# Patient Record
Sex: Female | Born: 1990
Health system: Southern US, Community
[De-identification: ages and names within clinical notes are randomized; demographics above are authoritative.]

## PROBLEM LIST (undated history)

## (undated) DIAGNOSIS — K219 Gastro-esophageal reflux disease without esophagitis: Secondary | ICD-10-CM

## (undated) DIAGNOSIS — G43009 Migraine without aura, not intractable, without status migrainosus: Secondary | ICD-10-CM

## (undated) DIAGNOSIS — E785 Hyperlipidemia, unspecified: Secondary | ICD-10-CM

## (undated) DIAGNOSIS — F32A Depression, unspecified: Secondary | ICD-10-CM

## (undated) DIAGNOSIS — F329 Major depressive disorder, single episode, unspecified: Secondary | ICD-10-CM

## (undated) DIAGNOSIS — J302 Other seasonal allergic rhinitis: Secondary | ICD-10-CM

## (undated) DIAGNOSIS — F419 Anxiety disorder, unspecified: Secondary | ICD-10-CM

## (undated) DIAGNOSIS — J45990 Exercise induced bronchospasm: Secondary | ICD-10-CM

## (undated) HISTORY — DX: Migraine without aura, not intractable, without status migrainosus: G43.009

## (undated) HISTORY — PX: NO PAST SURGERIES: SHX2092

## (undated) HISTORY — DX: Anxiety disorder, unspecified: F41.9

## (undated) HISTORY — DX: Gastro-esophageal reflux disease without esophagitis: K21.9

## (undated) HISTORY — DX: Other seasonal allergic rhinitis: J30.2

## (undated) HISTORY — DX: Major depressive disorder, single episode, unspecified: F32.9

## (undated) HISTORY — DX: Depression, unspecified: F32.A

## (undated) HISTORY — DX: Hyperlipidemia, unspecified: E78.5

## (undated) HISTORY — DX: Exercise induced bronchospasm: J45.990

---

## 2015-12-11 DIAGNOSIS — F419 Anxiety disorder, unspecified: Secondary | ICD-10-CM | POA: Diagnosis not present

## 2015-12-11 DIAGNOSIS — F339 Major depressive disorder, recurrent, unspecified: Secondary | ICD-10-CM | POA: Diagnosis not present

## 2015-12-11 DIAGNOSIS — F329 Major depressive disorder, single episode, unspecified: Secondary | ICD-10-CM | POA: Diagnosis not present

## 2016-01-10 DIAGNOSIS — Z1389 Encounter for screening for other disorder: Secondary | ICD-10-CM | POA: Diagnosis not present

## 2016-01-10 DIAGNOSIS — G43909 Migraine, unspecified, not intractable, without status migrainosus: Secondary | ICD-10-CM | POA: Diagnosis not present

## 2016-01-10 DIAGNOSIS — F418 Other specified anxiety disorders: Secondary | ICD-10-CM | POA: Diagnosis not present

## 2016-02-11 DIAGNOSIS — G43909 Migraine, unspecified, not intractable, without status migrainosus: Secondary | ICD-10-CM | POA: Diagnosis not present

## 2016-02-11 DIAGNOSIS — F418 Other specified anxiety disorders: Secondary | ICD-10-CM | POA: Diagnosis not present

## 2016-02-27 DIAGNOSIS — F419 Anxiety disorder, unspecified: Secondary | ICD-10-CM | POA: Diagnosis not present

## 2016-02-27 DIAGNOSIS — F339 Major depressive disorder, recurrent, unspecified: Secondary | ICD-10-CM | POA: Diagnosis not present

## 2016-02-28 DIAGNOSIS — K529 Noninfective gastroenteritis and colitis, unspecified: Secondary | ICD-10-CM | POA: Diagnosis not present

## 2016-02-28 DIAGNOSIS — F329 Major depressive disorder, single episode, unspecified: Secondary | ICD-10-CM | POA: Diagnosis not present

## 2016-02-28 DIAGNOSIS — R51 Headache: Secondary | ICD-10-CM | POA: Diagnosis not present

## 2016-03-04 DIAGNOSIS — Z113 Encounter for screening for infections with a predominantly sexual mode of transmission: Secondary | ICD-10-CM | POA: Diagnosis not present

## 2016-03-04 DIAGNOSIS — Z6833 Body mass index (BMI) 33.0-33.9, adult: Secondary | ICD-10-CM | POA: Diagnosis not present

## 2016-03-04 DIAGNOSIS — Z124 Encounter for screening for malignant neoplasm of cervix: Secondary | ICD-10-CM | POA: Diagnosis not present

## 2016-03-04 DIAGNOSIS — Z01419 Encounter for gynecological examination (general) (routine) without abnormal findings: Secondary | ICD-10-CM | POA: Diagnosis not present

## 2016-03-04 DIAGNOSIS — Z3041 Encounter for surveillance of contraceptive pills: Secondary | ICD-10-CM | POA: Diagnosis not present

## 2016-03-05 DIAGNOSIS — H9319 Tinnitus, unspecified ear: Secondary | ICD-10-CM | POA: Diagnosis not present

## 2016-03-05 DIAGNOSIS — G43019 Migraine without aura, intractable, without status migrainosus: Secondary | ICD-10-CM | POA: Diagnosis not present

## 2016-03-17 DIAGNOSIS — F331 Major depressive disorder, recurrent, moderate: Secondary | ICD-10-CM | POA: Diagnosis not present

## 2016-03-17 DIAGNOSIS — F419 Anxiety disorder, unspecified: Secondary | ICD-10-CM | POA: Diagnosis not present

## 2016-03-17 DIAGNOSIS — F4310 Post-traumatic stress disorder, unspecified: Secondary | ICD-10-CM | POA: Diagnosis not present

## 2016-03-19 DIAGNOSIS — F419 Anxiety disorder, unspecified: Secondary | ICD-10-CM | POA: Diagnosis not present

## 2016-03-19 DIAGNOSIS — F4310 Post-traumatic stress disorder, unspecified: Secondary | ICD-10-CM | POA: Diagnosis not present

## 2016-03-19 DIAGNOSIS — F331 Major depressive disorder, recurrent, moderate: Secondary | ICD-10-CM | POA: Diagnosis not present

## 2016-03-19 DIAGNOSIS — R41840 Attention and concentration deficit: Secondary | ICD-10-CM | POA: Diagnosis not present

## 2016-03-25 DIAGNOSIS — H9313 Tinnitus, bilateral: Secondary | ICD-10-CM | POA: Diagnosis not present

## 2016-03-28 DIAGNOSIS — G43919 Migraine, unspecified, intractable, without status migrainosus: Secondary | ICD-10-CM | POA: Diagnosis not present

## 2016-04-02 DIAGNOSIS — F419 Anxiety disorder, unspecified: Secondary | ICD-10-CM | POA: Diagnosis not present

## 2016-04-02 DIAGNOSIS — F4310 Post-traumatic stress disorder, unspecified: Secondary | ICD-10-CM | POA: Diagnosis not present

## 2016-04-02 DIAGNOSIS — F331 Major depressive disorder, recurrent, moderate: Secondary | ICD-10-CM | POA: Diagnosis not present

## 2016-04-15 DIAGNOSIS — F331 Major depressive disorder, recurrent, moderate: Secondary | ICD-10-CM | POA: Diagnosis not present

## 2016-04-29 DIAGNOSIS — F331 Major depressive disorder, recurrent, moderate: Secondary | ICD-10-CM | POA: Diagnosis not present

## 2016-04-29 DIAGNOSIS — F4310 Post-traumatic stress disorder, unspecified: Secondary | ICD-10-CM | POA: Diagnosis not present

## 2016-04-29 DIAGNOSIS — F419 Anxiety disorder, unspecified: Secondary | ICD-10-CM | POA: Diagnosis not present

## 2016-05-02 DIAGNOSIS — Z23 Encounter for immunization: Secondary | ICD-10-CM | POA: Diagnosis not present

## 2016-05-05 DIAGNOSIS — F419 Anxiety disorder, unspecified: Secondary | ICD-10-CM | POA: Diagnosis not present

## 2016-05-05 DIAGNOSIS — G479 Sleep disorder, unspecified: Secondary | ICD-10-CM | POA: Diagnosis not present

## 2016-05-05 DIAGNOSIS — R41840 Attention and concentration deficit: Secondary | ICD-10-CM | POA: Diagnosis not present

## 2016-05-05 DIAGNOSIS — F331 Major depressive disorder, recurrent, moderate: Secondary | ICD-10-CM | POA: Diagnosis not present

## 2016-05-07 DIAGNOSIS — H9319 Tinnitus, unspecified ear: Secondary | ICD-10-CM | POA: Diagnosis not present

## 2016-05-07 DIAGNOSIS — G43909 Migraine, unspecified, not intractable, without status migrainosus: Secondary | ICD-10-CM | POA: Diagnosis not present

## 2016-05-07 DIAGNOSIS — F329 Major depressive disorder, single episode, unspecified: Secondary | ICD-10-CM | POA: Diagnosis not present

## 2016-05-12 DIAGNOSIS — F331 Major depressive disorder, recurrent, moderate: Secondary | ICD-10-CM | POA: Diagnosis not present

## 2016-05-12 DIAGNOSIS — F4310 Post-traumatic stress disorder, unspecified: Secondary | ICD-10-CM | POA: Diagnosis not present

## 2016-05-12 DIAGNOSIS — F419 Anxiety disorder, unspecified: Secondary | ICD-10-CM | POA: Diagnosis not present

## 2016-05-26 DIAGNOSIS — F331 Major depressive disorder, recurrent, moderate: Secondary | ICD-10-CM | POA: Diagnosis not present

## 2016-05-26 DIAGNOSIS — F419 Anxiety disorder, unspecified: Secondary | ICD-10-CM | POA: Diagnosis not present

## 2016-05-26 DIAGNOSIS — F4310 Post-traumatic stress disorder, unspecified: Secondary | ICD-10-CM | POA: Diagnosis not present

## 2016-05-28 DIAGNOSIS — Z719 Counseling, unspecified: Secondary | ICD-10-CM | POA: Diagnosis not present

## 2016-06-03 DIAGNOSIS — F331 Major depressive disorder, recurrent, moderate: Secondary | ICD-10-CM | POA: Diagnosis not present

## 2016-06-03 DIAGNOSIS — F419 Anxiety disorder, unspecified: Secondary | ICD-10-CM | POA: Diagnosis not present

## 2016-06-03 DIAGNOSIS — G479 Sleep disorder, unspecified: Secondary | ICD-10-CM | POA: Diagnosis not present

## 2016-06-03 DIAGNOSIS — R41840 Attention and concentration deficit: Secondary | ICD-10-CM | POA: Diagnosis not present

## 2016-06-18 DIAGNOSIS — F4310 Post-traumatic stress disorder, unspecified: Secondary | ICD-10-CM | POA: Diagnosis not present

## 2016-06-18 DIAGNOSIS — F419 Anxiety disorder, unspecified: Secondary | ICD-10-CM | POA: Diagnosis not present

## 2016-06-18 DIAGNOSIS — F331 Major depressive disorder, recurrent, moderate: Secondary | ICD-10-CM | POA: Diagnosis not present

## 2016-07-08 DIAGNOSIS — R41840 Attention and concentration deficit: Secondary | ICD-10-CM | POA: Diagnosis not present

## 2016-07-08 DIAGNOSIS — F331 Major depressive disorder, recurrent, moderate: Secondary | ICD-10-CM | POA: Diagnosis not present

## 2016-07-08 DIAGNOSIS — G479 Sleep disorder, unspecified: Secondary | ICD-10-CM | POA: Diagnosis not present

## 2016-07-08 DIAGNOSIS — F419 Anxiety disorder, unspecified: Secondary | ICD-10-CM | POA: Diagnosis not present

## 2016-07-29 DIAGNOSIS — R9431 Abnormal electrocardiogram [ECG] [EKG]: Secondary | ICD-10-CM | POA: Diagnosis not present

## 2016-08-04 DIAGNOSIS — H9319 Tinnitus, unspecified ear: Secondary | ICD-10-CM | POA: Diagnosis not present

## 2016-08-04 DIAGNOSIS — G43909 Migraine, unspecified, not intractable, without status migrainosus: Secondary | ICD-10-CM | POA: Diagnosis not present

## 2016-08-04 DIAGNOSIS — G43109 Migraine with aura, not intractable, without status migrainosus: Secondary | ICD-10-CM | POA: Diagnosis not present

## 2016-08-11 DIAGNOSIS — F331 Major depressive disorder, recurrent, moderate: Secondary | ICD-10-CM | POA: Diagnosis not present

## 2016-08-11 DIAGNOSIS — F4310 Post-traumatic stress disorder, unspecified: Secondary | ICD-10-CM | POA: Diagnosis not present

## 2016-08-11 DIAGNOSIS — F419 Anxiety disorder, unspecified: Secondary | ICD-10-CM | POA: Diagnosis not present

## 2016-08-12 DIAGNOSIS — F331 Major depressive disorder, recurrent, moderate: Secondary | ICD-10-CM | POA: Diagnosis not present

## 2016-08-12 DIAGNOSIS — F900 Attention-deficit hyperactivity disorder, predominantly inattentive type: Secondary | ICD-10-CM | POA: Diagnosis not present

## 2016-08-12 DIAGNOSIS — F419 Anxiety disorder, unspecified: Secondary | ICD-10-CM | POA: Diagnosis not present

## 2016-08-12 DIAGNOSIS — G479 Sleep disorder, unspecified: Secondary | ICD-10-CM | POA: Diagnosis not present

## 2016-09-09 DIAGNOSIS — F4310 Post-traumatic stress disorder, unspecified: Secondary | ICD-10-CM | POA: Diagnosis not present

## 2016-09-09 DIAGNOSIS — G479 Sleep disorder, unspecified: Secondary | ICD-10-CM | POA: Diagnosis not present

## 2016-09-09 DIAGNOSIS — F331 Major depressive disorder, recurrent, moderate: Secondary | ICD-10-CM | POA: Diagnosis not present

## 2016-09-09 DIAGNOSIS — F419 Anxiety disorder, unspecified: Secondary | ICD-10-CM | POA: Diagnosis not present

## 2016-09-14 DIAGNOSIS — F3341 Major depressive disorder, recurrent, in partial remission: Secondary | ICD-10-CM | POA: Diagnosis not present

## 2016-09-14 DIAGNOSIS — G43009 Migraine without aura, not intractable, without status migrainosus: Secondary | ICD-10-CM | POA: Diagnosis not present

## 2016-09-14 DIAGNOSIS — F419 Anxiety disorder, unspecified: Secondary | ICD-10-CM | POA: Diagnosis not present

## 2016-09-14 DIAGNOSIS — R110 Nausea: Secondary | ICD-10-CM | POA: Diagnosis not present

## 2016-11-07 DIAGNOSIS — F331 Major depressive disorder, recurrent, moderate: Secondary | ICD-10-CM | POA: Diagnosis not present

## 2016-11-07 DIAGNOSIS — G479 Sleep disorder, unspecified: Secondary | ICD-10-CM | POA: Diagnosis not present

## 2016-11-07 DIAGNOSIS — F900 Attention-deficit hyperactivity disorder, predominantly inattentive type: Secondary | ICD-10-CM | POA: Diagnosis not present

## 2016-11-07 DIAGNOSIS — F419 Anxiety disorder, unspecified: Secondary | ICD-10-CM | POA: Diagnosis not present

## 2017-01-05 DIAGNOSIS — G479 Sleep disorder, unspecified: Secondary | ICD-10-CM | POA: Diagnosis not present

## 2017-01-05 DIAGNOSIS — F419 Anxiety disorder, unspecified: Secondary | ICD-10-CM | POA: Diagnosis not present

## 2017-01-05 DIAGNOSIS — F331 Major depressive disorder, recurrent, moderate: Secondary | ICD-10-CM | POA: Diagnosis not present

## 2017-01-05 DIAGNOSIS — F900 Attention-deficit hyperactivity disorder, predominantly inattentive type: Secondary | ICD-10-CM | POA: Diagnosis not present

## 2017-01-07 DIAGNOSIS — F329 Major depressive disorder, single episode, unspecified: Secondary | ICD-10-CM | POA: Diagnosis not present

## 2017-01-07 DIAGNOSIS — G43009 Migraine without aura, not intractable, without status migrainosus: Secondary | ICD-10-CM | POA: Diagnosis not present

## 2017-02-12 DIAGNOSIS — E7800 Pure hypercholesterolemia, unspecified: Secondary | ICD-10-CM | POA: Diagnosis not present

## 2017-02-12 DIAGNOSIS — E669 Obesity, unspecified: Secondary | ICD-10-CM | POA: Diagnosis not present

## 2017-02-13 DIAGNOSIS — G479 Sleep disorder, unspecified: Secondary | ICD-10-CM | POA: Diagnosis not present

## 2017-02-13 DIAGNOSIS — F419 Anxiety disorder, unspecified: Secondary | ICD-10-CM | POA: Diagnosis not present

## 2017-02-13 DIAGNOSIS — F331 Major depressive disorder, recurrent, moderate: Secondary | ICD-10-CM | POA: Diagnosis not present

## 2017-02-13 DIAGNOSIS — F900 Attention-deficit hyperactivity disorder, predominantly inattentive type: Secondary | ICD-10-CM | POA: Diagnosis not present

## 2017-04-10 DIAGNOSIS — G479 Sleep disorder, unspecified: Secondary | ICD-10-CM | POA: Diagnosis not present

## 2017-04-10 DIAGNOSIS — F331 Major depressive disorder, recurrent, moderate: Secondary | ICD-10-CM | POA: Diagnosis not present

## 2017-04-10 DIAGNOSIS — F900 Attention-deficit hyperactivity disorder, predominantly inattentive type: Secondary | ICD-10-CM | POA: Diagnosis not present

## 2017-04-10 DIAGNOSIS — F419 Anxiety disorder, unspecified: Secondary | ICD-10-CM | POA: Diagnosis not present

## 2017-04-16 MED FILL — PANTOPRAZOLE SOD DR 40 MG T: 40 | 30 days supply | Qty: 30 | Fill #0

## 2017-05-10 MED FILL — LARIN FE 1.5-30 TABLET: 1.5-30 | 28 days supply | Qty: 28 | Fill #0

## 2017-05-17 MED FILL — PANTOPRAZOLE SOD DR 40 MG T: 40 | 30 days supply | Qty: 30 | Fill #1

## 2017-05-20 DIAGNOSIS — Z124 Encounter for screening for malignant neoplasm of cervix: Secondary | ICD-10-CM | POA: Diagnosis not present

## 2017-05-20 DIAGNOSIS — Z3041 Encounter for surveillance of contraceptive pills: Secondary | ICD-10-CM | POA: Diagnosis not present

## 2017-05-20 DIAGNOSIS — Z01419 Encounter for gynecological examination (general) (routine) without abnormal findings: Secondary | ICD-10-CM | POA: Diagnosis not present

## 2017-05-20 DIAGNOSIS — Z6838 Body mass index (BMI) 38.0-38.9, adult: Secondary | ICD-10-CM | POA: Diagnosis not present

## 2017-05-21 DIAGNOSIS — F331 Major depressive disorder, recurrent, moderate: Secondary | ICD-10-CM | POA: Diagnosis not present

## 2017-05-21 DIAGNOSIS — G479 Sleep disorder, unspecified: Secondary | ICD-10-CM | POA: Diagnosis not present

## 2017-05-21 DIAGNOSIS — F900 Attention-deficit hyperactivity disorder, predominantly inattentive type: Secondary | ICD-10-CM | POA: Diagnosis not present

## 2017-05-21 DIAGNOSIS — F419 Anxiety disorder, unspecified: Secondary | ICD-10-CM | POA: Diagnosis not present

## 2017-05-27 ENCOUNTER — Ambulatory Visit (INDEPENDENT_AMBULATORY_CARE_PROVIDER_SITE_OTHER): Payer: BLUE CROSS/BLUE SHIELD | Admitting: Family Medicine

## 2017-05-27 ENCOUNTER — Encounter: Payer: Self-pay | Admitting: Family Medicine

## 2017-05-27 VITALS — BP 122/80 | HR 86 | Ht 63.25 in | Wt 222.0 lb

## 2017-05-27 DIAGNOSIS — R635 Abnormal weight gain: Secondary | ICD-10-CM

## 2017-05-27 DIAGNOSIS — R5383 Other fatigue: Secondary | ICD-10-CM

## 2017-05-27 DIAGNOSIS — M7989 Other specified soft tissue disorders: Secondary | ICD-10-CM | POA: Diagnosis not present

## 2017-05-27 DIAGNOSIS — E669 Obesity, unspecified: Secondary | ICD-10-CM

## 2017-05-27 DIAGNOSIS — F329 Major depressive disorder, single episode, unspecified: Secondary | ICD-10-CM | POA: Diagnosis not present

## 2017-05-27 DIAGNOSIS — Z79899 Other long term (current) drug therapy: Secondary | ICD-10-CM

## 2017-05-27 DIAGNOSIS — R600 Localized edema: Secondary | ICD-10-CM

## 2017-05-27 DIAGNOSIS — G43009 Migraine without aura, not intractable, without status migrainosus: Secondary | ICD-10-CM

## 2017-05-27 DIAGNOSIS — Z8639 Personal history of other endocrine, nutritional and metabolic disease: Secondary | ICD-10-CM

## 2017-05-27 DIAGNOSIS — F419 Anxiety disorder, unspecified: Secondary | ICD-10-CM | POA: Diagnosis not present

## 2017-05-27 DIAGNOSIS — K219 Gastro-esophageal reflux disease without esophagitis: Secondary | ICD-10-CM | POA: Insufficient documentation

## 2017-05-27 DIAGNOSIS — R609 Edema, unspecified: Secondary | ICD-10-CM | POA: Diagnosis not present

## 2017-05-27 DIAGNOSIS — F32A Depression, unspecified: Secondary | ICD-10-CM | POA: Insufficient documentation

## 2017-05-27 LAB — POCT URINALYSIS DIP (PROADVANTAGE DEVICE)
Bilirubin, UA: NEGATIVE
Blood, UA: NEGATIVE
Glucose, UA: NEGATIVE mg/dL
Ketones, POC UA: NEGATIVE mg/dL
Nitrite, UA: NEGATIVE
Protein Ur, POC: NEGATIVE mg/dL
Specific Gravity, Urine: 1.03
Urobilinogen, Ur: NEGATIVE
pH, UA: 6

## 2017-05-27 NOTE — Progress Notes (Signed)
Subjective:    Patient ID: Alicia Weiss, female    DOB: 10/28/1990, 28 y.o.   MRN: [id number]  HPI Chief Complaint  Patient presents with  . new pt    new pt, get established, blood pressure has been running high but don't know actual numbers but states numbers are close to each other with top and bottom number,  want chol checked but not fasting today   She is new to the practice and here to establish care and for multiple concerns.  Previous medical care: [place], [place] and moved here in May 2018. She continues traveling back and forth seeing multiple specialists.  Dr. Wannetta Sender PCP in [place].  Dr. Jeri Cos- OB/GYN in [facility]- psychiatrist in [place]. Is being treated for depression and anxiety. New medication Viibryd, states she started on this several months ago. Has tried multiple medications without success including: Cymbalta (headaches and zombie-like), Sertraline, Wellbutrin, Lexapro, citalopram.  States she takes Hydroxyzine for anxiety. Rarely takes this due to sedation.  States she was taking Rexalti in the past and stopped it last week due to increased heart rate.  Dr. Sandy Salaam- neurologist for migraines. Diagnosed 3 years ago. Started on Maxalt at that time and then Imitrex.   Migraines - Imitrex in past but was causing "serotonin syndrome" and she was switched this week to Fiorcet but has not yet tried this. States she has not had a migraine this week.   ADHD- has taken medication in the past for this. Tried Vyvnanse - No longer taking this. States she stopped this due to increased heart rate.   Other providers: none locally. Is going back to [facility] for her specialists.   Concerns today include having elevated blood pressure in the past. She is not sure what her BP was but has been told her BP was elevated in the past month.   States her heart rate has been elevated to 116 while at work according to her fit bit. EKG in the past was normal. History of  palpitations. No chest pain, shortness of breath, cough, orthopnea.   Complains of hands and feet feeling swollen since starting work in August. Swelling occasionally goes away at night.  Reports a 25-30 lb weight gain over the past 6 months. She does not know how many calories she is eating per day but does report change in eating habits since moving here in May. She is not exercising.  New job that requires her standing all day.  Reports having multiple medication changes for depression and anxiety over the past few months.   States she has been feeling more tired than usual over the past several months.   Denies fever, chills, dizziness, chest pain, abdominal pain, N/V/D, or urinary symptoms. Denies numbness, tingling or weakness. No arthralgias or myalgias.     Social history: Lives with her boyfriend, works at [employer].  No children.   Depression screen PHQ 2/9 05/27/2017  Decreased Interest 1  Down, Depressed, Hopeless 1  PHQ - 2 Score 2  Altered sleeping 0  Tired, decreased energy 3  Change in appetite 3  Feeling bad or failure about yourself  0  Trouble concentrating 3  Moving slowly or fidgety/restless 0  Suicidal thoughts 0  PHQ-9 Score 11  Difficult doing work/chores Somewhat difficult    Health maintenance:  Last Gynecological Exam: pap smear- last week.  Last Menstrual cycle: regular cycles. Last one 3 weeks ago.  Pregnancies: 0 Contraception: OCPs  Reviewed allergies, medications, past  medical, surgical, family, and social history.   Review of Systems Pertinent positives and negatives in the history of present illness.      Objective:   Physical Exam BP 122/80   Pulse 86   Ht 5' 3.25" (1.607 m)   Wt 222 lb (100.7 kg)   LMP 05/06/2017   BMI 39.02 kg/m   Alert and in no distress.  Pharyngeal area is normal. Neck is supple without adenopathy or thyromegaly. Cardiac exam shows a regular sinus rhythm without murmurs or gallops. Lungs are  clear to auscultation. Skin warm and dry, no pallor. Extremities without edema, normal pulses.       Assessment & Plan:  Swelling of both hands - Plan: CBC with Differential/Platelet, POCT Urinalysis DIP (Proadvantage Device)  Anxiety and depression - Plan: TSH  Migraine without aura and without status migrainosus, not intractable  Fatigue, unspecified type - Plan: CBC with Differential/Platelet, Comprehensive metabolic panel, POCT Urinalysis DIP (Proadvantage Device), TSH, VITAMIN D 25 Hydroxy (Vit-D Deficiency, Fractures)  Mild peripheral edema - Plan: POCT Urinalysis DIP (Proadvantage Device), TSH  Obesity (BMI 30-39.9) - Plan: TSH  History of vitamin D deficiency - Plan: VITAMIN D 25 Hydroxy (Vit-D Deficiency, Fractures)  Medication management - Plan: VITAMIN D 25 Hydroxy (Vit-D Deficiency, Fractures)  Discussed that the tightness she is experiencing in her hands and feet may be due to high salt diet and recent weight gain.  Will check labs to look for underlying etiologies for fatigue, weight gain, subjective edema.  Recommend elevating her legs when possible and wearing compression stockings at work.  Discussed that her BP and heart are in goal range. Taught her how to check carotid pulse and recommend that if she feels like her heart is beating fast to check it and keep a record of this.  She will also by a BP cuff and check her BP and heart rate. Bring in the readings for these to her next visit.  Recent weight gain- advised her to try using a free app like My Fitness Pal and to track her calories and nutrition intake. Start taking walks or doing some type of exercise.  She will continue seeing specialists as needed for chronic health conditions.  She is not suicidal. Mood is stable per patient.  She would like to have her cholesterol checked and will return for a fasting CPE at her convenience.  Follow up in 3-4 weeks for concerns with BP, heart rate and weight.  Spent a  minimum of 45 minutes with patient face to face and at least 50% was in counseling and coordination of care.

## 2017-05-27 NOTE — Patient Instructions (Signed)
Cut back on salt, fried foods, fatty foods, creamy sauces and dressings. Increase healthy foods, drink more water and start getting some type of exercise.  You may wear compression stockings and elevate your legs when possible to see if this helps with the swelling in your feet.   Start checking your blood pressure and heart rate and keep a record of these readings.  Goal BP is <130/80  Bring in your BP cuff and readings to your next visit.   Check your heart rate as we demonstrated in the office today if you feel like your heart rate is too fast or abnormal.   Start using a free app like My fitness pal to track your calories and nutrition.   We will call you with your lab results.

## 2017-05-28 LAB — CBC WITH DIFFERENTIAL/PLATELET
Basophils Absolute: 53 cells/uL (ref 0–200)
Basophils Relative: 0.7 %
Eosinophils Absolute: 122 cells/uL (ref 15–500)
Eosinophils Relative: 1.6 %
HCT: 36.9 % (ref 35.0–45.0)
Hemoglobin: 12.4 g/dL (ref 11.7–15.5)
Lymphs Abs: 2105 cells/uL (ref 850–3900)
MCH: 28.1 pg (ref 27.0–33.0)
MCHC: 33.6 g/dL (ref 32.0–36.0)
MCV: 83.7 fL (ref 80.0–100.0)
MPV: 11.6 fL (ref 7.5–12.5)
Monocytes Relative: 5.7 %
Neutro Abs: 4887 cells/uL (ref 1500–7800)
Neutrophils Relative %: 64.3 %
Platelets: 401 10*3/uL — ABNORMAL HIGH (ref 140–400)
RBC: 4.41 10*6/uL (ref 3.80–5.10)
RDW: 12.3 % (ref 11.0–15.0)
Total Lymphocyte: 27.7 %
WBC mixed population: 433 cells/uL (ref 200–950)
WBC: 7.6 10*3/uL (ref 3.8–10.8)

## 2017-05-28 LAB — COMPREHENSIVE METABOLIC PANEL
AG Ratio: 1.3 (calc) (ref 1.0–2.5)
ALT: 11 U/L (ref 6–29)
AST: 11 U/L (ref 10–30)
Albumin: 4 g/dL (ref 3.6–5.1)
Alkaline phosphatase (APISO): 54 U/L (ref 33–115)
BUN: 9 mg/dL (ref 7–25)
CO2: 24 mmol/L (ref 20–32)
Calcium: 9.8 mg/dL (ref 8.6–10.2)
Chloride: 103 mmol/L (ref 98–110)
Creat: 0.89 mg/dL (ref 0.50–1.10)
Globulin: 3 g/dL (calc) (ref 1.9–3.7)
Glucose, Bld: 80 mg/dL (ref 65–99)
Potassium: 4.5 mmol/L (ref 3.5–5.3)
Sodium: 137 mmol/L (ref 135–146)
Total Bilirubin: 0.2 mg/dL (ref 0.2–1.2)
Total Protein: 7 g/dL (ref 6.1–8.1)

## 2017-05-28 LAB — VITAMIN D 25 HYDROXY (VIT D DEFICIENCY, FRACTURES): Vit D, 25-Hydroxy: 26 ng/mL — ABNORMAL LOW (ref 30–100)

## 2017-05-28 LAB — TSH: TSH: 1.27 mIU/L

## 2017-06-16 MED FILL — PANTOPRAZOLE SOD DR 40 MG T: 40 | 30 days supply | Qty: 30 | Fill #2

## 2017-06-22 NOTE — Progress Notes (Signed)
Subjective:    Patient ID: Alicia Weiss, female    DOB: 1990/11/09, 26 y.o.   MRN: [id number]  HPI Chief Complaint  Patient presents with  . Annual Exam    fasting annual exam, no pap sees someone is [place]. Also follow up on hand swelling and blood pressure. Has a spot on front of left shin that she would like looked at today.   She is here for a complete physical exam. Last CPE: 2 years ago   She is requesting a neurologist referral for migraine headache history. States she was diagnosed 3 years ago in [place].  States she has been traveling back and forth to [place] neurologist in [place]. Complicated migraine history per patient. States she has multiple therapies in the past past without success. States she was most recently prescribed Fioricet and this is not helping. She would like to see someone to discuss other treatment options.  States she is getting migraine headaches every week lasting 1-2 days. No aura. Mainly on left in occipital region. Typically has nausea, photophobia and phonophobia.  States she has had 2 MRIs in the past that were negative.   She has been checking her BP at home. Readings at home have been in goal range. 112/78 and in this range.   She stopped taking Rexulti because she thought she was having side effects such as flushing and elevated HR. States those symptoms have resolved.   Anxiety and depression - under the care of [place] psychiatrist in [place].   States she has a small dark mole on her left leg that she would like to have checked. Thinks it is not changing.   Other providers: Dr. Jeri CosPestoff- in [place] is [place]  Takes Protonix daily for GERD.   Social history: Lives with boyfriend, works as Associate Professorpharmacy tech at [employer] Denies smoking, drinking alcohol, drug use  Diet: no changes.  Excerise: nothing  Reports negative HIV test last year.   Immunizations: up to date on flu and Tdap  Health maintenance:  Mammogram: never Colonoscopy: never Last  Pap Smear: last month. Normal.  Last Menstrual cycle: 3 weeks ago.  Pregnancies: 0 Last Dental Exam: 2016  Last Eye Exam: last year   Declines STD testing.   Wears seatbelt always, uses sunscreen, smoke detectors in home and functioning, does not text while driving and feels safe in home environment.   Reviewed allergies, medications, past medical, surgical, family, and social history.   Review of Systems Review of Systems Constitutional: -fever, -chills, -sweats, -unexpected weight change,-fatigue ENT: -runny nose, -ear pain, -sore throat Cardiology:  -chest pain, -palpitations, -edema Respiratory: -cough, -shortness of breath, -wheezing Gastroenterology: -abdominal pain, -nausea, -vomiting, -diarrhea, -constipation  Hematology: -bleeding or bruising problems Musculoskeletal: -arthralgias, -myalgias, -joint swelling, -back pain Ophthalmology: -vision changes Urology: -dysuria, -difficulty urinating, -hematuria, -urinary frequency, -urgency Neurology: -headache, -weakness, -tingling, -numbness       Objective:   Physical Exam BP 138/88   Pulse 80   Ht 5' 3.5" (1.613 m)   Wt 220 lb (99.8 kg)   LMP 05/26/2017 (Approximate)   BMI 38.36 kg/m   General Appearance:    Alert, cooperative, no distress, appears stated age  Head:    Normocephalic, without obvious abnormality, atraumatic  Eyes:    PERRL, conjunctiva/corneas clear, EOM's intact, fundi    benign  Ears:    Normal TM's and external ear canals  Nose:   Nares normal, mucosa normal, no drainage or sinus   tenderness  Throat:   Lips,  mucosa, and tongue normal; teeth and gums normal  Neck:   Supple, no lymphadenopathy;  thyroid:  no   enlargement/tenderness/nodules; no carotid   bruit or JVD  Back:    Spine nontender, no curvature, ROM normal, no CVA     tenderness  Lungs:     Clear to auscultation bilaterally without wheezes, rales or     ronchi; respirations unlabored  Chest Wall:    No tenderness or deformity    Heart:    Regular rate and rhythm, S1 and S2 normal, no murmur, rub   or gallop  Breast Exam:    OB/GYN  Abdomen:     Soft, non-tender, nondistended, normoactive bowel sounds,    no masses, no hepatosplenomegaly  Genitalia:    OB/GYN  Rectal:    Not performed due to age<40 and no related complaints  Extremities:   No clubbing, cyanosis or edema  Pulses:   2+ and symmetric all extremities  Skin:   Skin color, texture, turgor normal, no rashes. Small, round dark spot on left anterior lower leg.   Lymph nodes:   Cervical, supraclavicular, and axillary nodes normal  Neurologic:   CNII-XII intact, normal strength, sensation and gait; reflexes 2+ and symmetric throughout          Psych:   Normal mood, affect, hygiene and grooming.     urinalysis dipstick: negative      Assessment & Plan:  Routine general medical examination at a health care facility - Plan: Visual acuity screening, Lipid panel, POCT Urinalysis DIP (Proadvantage Device)  Gastroesophageal reflux disease, esophagitis presence not specified  Migraine without aura and without status migrainosus, not intractable - Plan: Ambulatory referral to Neurology  Obesity (BMI 30-39.9)  Skin lesion of left leg  She appears to be doing well. Denies feeling depressed or anxious lately. States medication is working well for her. [place] monthly.  Will refer to neurologist for chronic migraines.  Counseled on healthy lifestyle, cutting back on carbohydrates and increasing exercise.  Discussed benign appearance of lesion on leg. Encouraged her to take a picture of the spot. Encouraged her to keep an eye on the area and if she notices changes to see a dermatologist or return.

## 2017-06-23 ENCOUNTER — Encounter: Payer: Self-pay | Admitting: Family Medicine

## 2017-06-23 ENCOUNTER — Ambulatory Visit (INDEPENDENT_AMBULATORY_CARE_PROVIDER_SITE_OTHER): Payer: BLUE CROSS/BLUE SHIELD | Admitting: Family Medicine

## 2017-06-23 VITALS — BP 138/88 | HR 80 | Ht 63.5 in | Wt 220.0 lb

## 2017-06-23 DIAGNOSIS — E669 Obesity, unspecified: Secondary | ICD-10-CM | POA: Diagnosis not present

## 2017-06-23 DIAGNOSIS — L989 Disorder of the skin and subcutaneous tissue, unspecified: Secondary | ICD-10-CM | POA: Diagnosis not present

## 2017-06-23 DIAGNOSIS — G43009 Migraine without aura, not intractable, without status migrainosus: Secondary | ICD-10-CM

## 2017-06-23 DIAGNOSIS — K219 Gastro-esophageal reflux disease without esophagitis: Secondary | ICD-10-CM | POA: Diagnosis not present

## 2017-06-23 DIAGNOSIS — Z0000 Encounter for general adult medical examination without abnormal findings: Secondary | ICD-10-CM

## 2017-06-23 LAB — POCT URINALYSIS DIP (PROADVANTAGE DEVICE)
Bilirubin, UA: NEGATIVE
Blood, UA: NEGATIVE
Glucose, UA: NEGATIVE mg/dL
Ketones, POC UA: NEGATIVE mg/dL
Leukocytes, UA: NEGATIVE
Nitrite, UA: NEGATIVE
Protein Ur, POC: NEGATIVE mg/dL
Specific Gravity, Urine: 1.03
Urobilinogen, Ur: NEGATIVE
pH, UA: 6 (ref 5.0–8.0)

## 2017-06-23 LAB — LIPID PANEL
Cholesterol: 224 mg/dL — ABNORMAL HIGH (ref <200)
HDL: 44 mg/dL — ABNORMAL LOW (ref >50)
LDL Cholesterol (Calc): 135 mg/dL (calc) — ABNORMAL HIGH
Non-HDL Cholesterol (Calc): 180 mg/dL (calc) — ABNORMAL HIGH (ref <130)
Total CHOL/HDL Ratio: 5.1 (calc) — ABNORMAL HIGH (ref <5.0)
Triglycerides: 313 mg/dL — ABNORMAL HIGH (ref <150)

## 2017-06-23 NOTE — Patient Instructions (Addendum)
You should receive a phone call from [facility] in the next 2 weeks. Let me know if you do not hear from them.  We will call you with your lab results.   Try to skip some days with the Protonix. Avoid food triggers that make your reflux worse.    Heartburn Heartburn is a type of pain or discomfort that can happen in the throat or chest. It is often described as a burning pain. It may also cause a bad taste in the mouth. Heartburn may feel worse when you lie down or bend over, and it is often worse at night. Heartburn may be caused by stomach contents that move back up into the esophagus (reflux). Follow these instructions at home: Take these actions to decrease your discomfort and to help avoid complications. Diet  Follow a diet as recommended by your health care provider. This may involve avoiding foods and drinks such as: ? Coffee and tea (with or without caffeine). ? Drinks that contain alcohol. ? Energy drinks and sports drinks. ? Carbonated drinks or sodas. ? Chocolate and cocoa. ? Peppermint and mint flavorings. ? Garlic and onions. ? Horseradish. ? Spicy and acidic foods, including peppers, chili powder, curry powder, vinegar, hot sauces, and barbecue sauce. ? Citrus fruit juices and citrus fruits, such as oranges, lemons, and limes. ? Tomato-based foods, such as red sauce, chili, salsa, and pizza with red sauce. ? Fried and fatty foods, such as donuts, french fries, potato chips, and high-fat dressings. ? High-fat meats, such as hot dogs and fatty cuts of red and white meats, such as rib eye steak, sausage, ham, and bacon. ? High-fat dairy items, such as whole milk, butter, and cream cheese.  Eat small, frequent meals instead of large meals.  Avoid drinking large amounts of liquid with your meals.  Avoid eating meals during the 2-3 hours before bedtime.  Avoid lying down right after you eat.  Do not exercise right after you eat. General instructions  Pay  attention to any changes in your symptoms.  Take over-the-counter and prescription medicines only as told by your health care provider. Do not take aspirin, ibuprofen, or other NSAIDs unless your health care provider told you to do so.  Do not use any tobacco products, including cigarettes, chewing tobacco, and e-cigarettes. If you need help quitting, ask your health care provider.  Wear loose-fitting clothing. Do not wear anything tight around your waist that causes pressure on your abdomen.  Raise (elevate) the head of your bed about 6 inches (15 cm).  Try to reduce your stress, such as with yoga or meditation. If you need help reducing stress, ask your health care provider.  If you are overweight, reduce your weight to an amount that is healthy for you. Ask your health care provider for guidance about a safe weight loss goal.  Keep all follow-up visits as told by your health care provider. This is important. Contact a health care provider if:  You have new symptoms.  You have unexplained weight loss.  You have difficulty swallowing, or it hurts to swallow.  You have wheezing or a persistent cough.  Your symptoms do not improve with treatment.  You have frequent heartburn for more than two weeks. Get help right away if:  You have pain in your arms, neck, jaw, teeth, or back.  You feel sweaty, dizzy, or light-headed.  You have chest pain or shortness of breath.  You vomit and your vomit looks like blood or  coffee grounds.  Your stool is bloody or black. This information is not intended to replace advice given to you by your health care provider. Make sure you discuss any questions you have with your health care provider. Document Released: 12/06/2008 Document Revised: 12/26/2015 Document Reviewed: 11/14/2014 Elsevier Interactive Patient Education  2017 [employer].     Preventative Care for Adults - Female      MAINTAIN REGULAR HEALTH EXAMS:  A routine yearly  physical is a good way to check in with your primary care provider about your health and preventive screening. It is also an opportunity to share updates about your health and any concerns you have, and receive a thorough all-over exam.   Most health insurance companies pay for at least some preventative services.  Check with your health plan for specific coverages.  WHAT PREVENTATIVE SERVICES DO WOMEN NEED?  Adult women should have their weight and blood pressure checked regularly.   Women age 33 and older should have their cholesterol levels checked regularly.  Women should be screened for cervical cancer with a Pap smear and pelvic exam beginning at either age 59, or 3 years after they become sexually activity.    Breast cancer screening generally begins at age 53 with a mammogram and breast exam by your primary care provider.    Beginning at age 36 and continuing to age 65, women should be screened for colorectal cancer.  Certain people may need continued testing until age 62.  Updating vaccinations is part of preventative care.  Vaccinations help protect against diseases such as the flu.  Osteoporosis is a disease in which the bones lose minerals and strength as we age. Women ages 42 and over should discuss this with their caregivers, as should women after menopause who have other risk factors.  Lab tests are generally done as part of preventative care to screen for anemia and blood disorders, to screen for problems with the kidneys and liver, to screen for bladder problems, to check blood sugar, and to check your cholesterol level.  Preventative services generally include counseling about diet, exercise, avoiding tobacco, drugs, excessive alcohol consumption, and sexually transmitted infections.    GENERAL RECOMMENDATIONS FOR GOOD HEALTH:  Healthy diet:  Eat a variety of foods, including fruit, vegetables, animal or vegetable protein, such as meat, fish, chicken, and eggs, or beans,  lentils, tofu, and grains, such as rice.  Drink plenty of water daily.  Decrease saturated fat in the diet, avoid lots of red meat, processed foods, sweets, fast foods, and fried foods.  Exercise:  Aerobic exercise helps maintain good heart health. At least 30-40 minutes of moderate-intensity exercise is recommended. For example, a brisk walk that increases your heart rate and breathing. This should be done on most days of the week.   Find a type of exercise or a variety of exercises that you enjoy so that it becomes a part of your daily life.  Examples are running, walking, swimming, water aerobics, and biking.  For motivation and support, explore group exercise such as aerobic class, spin class, Zumba, Yoga,or  martial arts, etc.    Set exercise goals for yourself, such as a certain weight goal, walk or run in a race such as a 5k walk/run.  Speak to your primary care provider about exercise goals.  Disease prevention:  If you smoke or chew tobacco, find out from your caregiver how to quit. It can literally save your life, no matter how long you have been a  tobacco user. If you do not use tobacco, never begin.   Maintain a healthy diet and normal weight. Increased weight leads to problems with blood pressure and diabetes.   The Body Mass Index or BMI is a way of measuring how much of your body is fat. Having a BMI above 27 increases the risk of heart disease, diabetes, hypertension, stroke and other problems related to obesity. Your caregiver can help determine your BMI and based on it develop an exercise and dietary program to help you achieve or maintain this important measurement at a healthful level.  High blood pressure causes heart and blood vessel problems.  Persistent high blood pressure should be treated with medicine if weight loss and exercise do not work.   Fat and cholesterol leaves deposits in your arteries that can block them. This causes heart disease and vessel disease  elsewhere in your body.  If your cholesterol is found to be high, or if you have heart disease or certain other medical conditions, then you may need to have your cholesterol monitored frequently and be treated with medication.   Ask if you should have a cardiac stress test if your history suggests this. A stress test is a test done on a treadmill that looks for heart disease. This test can find disease prior to there being a problem.  Menopause can be associated with physical symptoms and risks. Hormone replacement therapy is available to decrease these. You should talk to your caregiver about whether starting or continuing to take hormones is right for you.   Osteoporosis is a disease in which the bones lose minerals and strength as we age. This can result in serious bone fractures. Risk of osteoporosis can be identified using a bone density scan. Women ages 32 and over should discuss this with their caregivers, as should women after menopause who have other risk factors. Ask your caregiver whether you should be taking a calcium supplement and Vitamin D, to reduce the rate of osteoporosis.   Avoid drinking alcohol in excess (more than two drinks per day).  Avoid use of street drugs. Do not share needles with anyone. Ask for professional help if you need assistance or instructions on stopping the use of alcohol, cigarettes, and/or drugs.  Brush your teeth twice a day with fluoride toothpaste, and floss once a day. Good oral hygiene prevents tooth decay and gum disease. The problems can be painful, unattractive, and can cause other health problems. Visit your dentist for a routine oral and dental check up and preventive care every 6-12 months.   Look at your skin regularly.  Use a mirror to look at your back. Notify your caregivers of changes in moles, especially if there are changes in shapes, colors, a size larger than a pencil eraser, an irregular border, or development of new moles.  Safety:  Use  seatbelts 100% of the time, whether driving or as a passenger.  Use safety devices such as hearing protection if you work in environments with loud noise or significant background noise.  Use safety glasses when doing any work that could send debris in to the eyes.  Use a helmet if you ride a bike or motorcycle.  Use appropriate safety gear for contact sports.  Talk to your caregiver about gun safety.  Use sunscreen with a SPF (or skin protection factor) of 15 or greater.  Lighter skinned people are at a greater risk of skin cancer. Don't forget to also wear sunglasses in order to protect  your eyes from too much damaging sunlight. Damaging sunlight can accelerate cataract formation.   Practice safe sex. Use condoms. Condoms are used for birth control and to help reduce the spread of sexually transmitted infections (or STIs).  Some of the STIs are gonorrhea (the clap), chlamydia, syphilis, trichomonas, herpes, HPV (human papilloma virus) and HIV (human immunodeficiency virus) which causes AIDS. The herpes, HIV and HPV are viral illnesses that have no cure. These can result in disability, cancer and death.   Keep carbon monoxide and smoke detectors in your home functioning at all times. Change the batteries every 6 months or use a model that plugs into the wall.   Vaccinations:  Stay up to date with your tetanus shots and other required immunizations. You should have a booster for tetanus every 10 years. Be sure to get your flu shot every year, since 5%-20% of the [place] population comes down with the flu. The flu vaccine changes each year, so being vaccinated once is not enough. Get your shot in the fall, before the flu season peaks.   Other vaccines to consider:  Human Papilloma Virus or HPV causes cancer of the cervix, and other infections that can be transmitted from person to person. There is a vaccine for HPV, and females should get immunized between the ages of 511 and 4326. It requires a series of 3  shots.   Pneumococcal vaccine to protect against certain types of pneumonia.  This is normally recommended for adults age 365 or older.  However, adults younger than 28 years old with certain underlying conditions such as diabetes, heart or lung disease should also receive the vaccine.  Shingles vaccine to protect against Varicella Zoster if you are older than age 28, or younger than 28 years old with certain underlying illness.  Hepatitis A vaccine to protect against a form of infection of the liver by a virus acquired from food.  Hepatitis B vaccine to protect against a form of infection of the liver by a virus acquired from blood or body fluids, particularly if you work in health care.  If you plan to travel internationally, check with your local health department for specific vaccination recommendations.  Cancer Screening:  Breast cancer screening is essential to preventive care for women. All women age 28 and older should perform a breast self-exam every month. At age 28 and older, women should have their caregiver complete a breast exam each year. Women at ages 6440 and older should have a mammogram (x-ray film) of the breasts. Your caregiver can discuss how often you need mammograms.    Cervical cancer screening includes taking a Pap smear (sample of cells examined under a microscope) from the cervix (end of the uterus). It also includes testing for HPV (Human Papilloma Virus, which can cause cervical cancer). Screening and a pelvic exam should begin at age 28, or 3 years after a woman becomes sexually active. Screening should occur every year, with a Pap smear but no HPV testing, up to age 28. After age 28, you should have a Pap smear every 3 years with HPV testing, if no HPV was found previously.   Most routine colon cancer screening begins at the age of 28. On a yearly basis, doctors may provide special easy to use take-home tests to check for hidden blood in the stool. Sigmoidoscopy or  colonoscopy can detect the earliest forms of colon cancer and is life saving. These tests use a small camera at the end of a tube  to directly examine the colon. Speak to your caregiver about this at age 77, when routine screening begins (and is repeated every 5 years unless early forms of pre-cancerous polyps or small growths are found).

## 2017-06-27 ENCOUNTER — Encounter: Payer: Self-pay | Admitting: Family Medicine

## 2017-06-27 DIAGNOSIS — E785 Hyperlipidemia, unspecified: Secondary | ICD-10-CM

## 2017-06-27 HISTORY — DX: Hyperlipidemia, unspecified: E78.5

## 2017-07-13 MED FILL — PANTOPRAZOLE SOD DR 40 MG T: 40 | 30 days supply | Qty: 30 | Fill #3 | Status: TO

## 2017-07-24 DIAGNOSIS — G479 Sleep disorder, unspecified: Secondary | ICD-10-CM | POA: Diagnosis not present

## 2017-07-24 DIAGNOSIS — F419 Anxiety disorder, unspecified: Secondary | ICD-10-CM | POA: Diagnosis not present

## 2017-07-24 DIAGNOSIS — F900 Attention-deficit hyperactivity disorder, predominantly inattentive type: Secondary | ICD-10-CM | POA: Diagnosis not present

## 2017-07-24 DIAGNOSIS — F331 Major depressive disorder, recurrent, moderate: Secondary | ICD-10-CM | POA: Diagnosis not present

## 2017-08-03 HISTORY — PX: LUMBAR PUNCTURE: SHX1985

## 2017-08-12 DIAGNOSIS — F900 Attention-deficit hyperactivity disorder, predominantly inattentive type: Secondary | ICD-10-CM | POA: Diagnosis not present

## 2017-08-12 DIAGNOSIS — F419 Anxiety disorder, unspecified: Secondary | ICD-10-CM | POA: Diagnosis not present

## 2017-08-12 DIAGNOSIS — G479 Sleep disorder, unspecified: Secondary | ICD-10-CM | POA: Diagnosis not present

## 2017-08-12 DIAGNOSIS — F331 Major depressive disorder, recurrent, moderate: Secondary | ICD-10-CM | POA: Diagnosis not present

## 2017-09-24 ENCOUNTER — Encounter: Payer: Self-pay | Admitting: Family Medicine

## 2017-09-30 DIAGNOSIS — G479 Sleep disorder, unspecified: Secondary | ICD-10-CM | POA: Diagnosis not present

## 2017-09-30 DIAGNOSIS — F900 Attention-deficit hyperactivity disorder, predominantly inattentive type: Secondary | ICD-10-CM | POA: Diagnosis not present

## 2017-09-30 DIAGNOSIS — F331 Major depressive disorder, recurrent, moderate: Secondary | ICD-10-CM | POA: Diagnosis not present

## 2017-09-30 DIAGNOSIS — F419 Anxiety disorder, unspecified: Secondary | ICD-10-CM | POA: Diagnosis not present

## 2017-10-11 ENCOUNTER — Telehealth: Payer: Self-pay | Admitting: Family Medicine

## 2017-10-11 DIAGNOSIS — G43009 Migraine without aura, not intractable, without status migrainosus: Secondary | ICD-10-CM

## 2017-10-11 NOTE — Telephone Encounter (Signed)
Left message for pt to call me back 

## 2017-10-11 NOTE — Telephone Encounter (Signed)
Pt called and is requesting another referral for neurology states the other one expired she is coming in for a medcheck march the 20th pt can be reached at [phone] informed pt that you was out of the office today

## 2017-10-11 NOTE — Telephone Encounter (Signed)
Can you check on this? Ok to refer to neurology. States the other one expired.

## 2017-10-13 NOTE — Telephone Encounter (Signed)
Pt moved and has since moved back to town and needed another referral

## 2017-10-13 NOTE — Addendum Note (Signed)
Addended by: Herminio CommonsJOHNSON, SABRINA A on: 10/13/2017 11:13 AM   Modules accepted: Orders

## 2017-10-14 ENCOUNTER — Encounter: Payer: Self-pay | Admitting: Internal Medicine

## 2017-10-20 ENCOUNTER — Encounter: Payer: Self-pay | Admitting: Family Medicine

## 2017-10-20 ENCOUNTER — Ambulatory Visit: Payer: BLUE CROSS/BLUE SHIELD | Admitting: Family Medicine

## 2017-10-20 VITALS — BP 120/80 | HR 99 | Resp 16 | Wt 216.4 lb

## 2017-10-20 DIAGNOSIS — J302 Other seasonal allergic rhinitis: Secondary | ICD-10-CM

## 2017-10-20 DIAGNOSIS — J45990 Exercise induced bronchospasm: Secondary | ICD-10-CM

## 2017-10-20 DIAGNOSIS — G43009 Migraine without aura, not intractable, without status migrainosus: Secondary | ICD-10-CM | POA: Diagnosis not present

## 2017-10-20 HISTORY — DX: Other seasonal allergic rhinitis: J30.2

## 2017-10-20 HISTORY — DX: Exercise induced bronchospasm: J45.990

## 2017-10-20 MED ORDER — SUMATRIPTAN SUCCINATE 100 MG PO TABS: ORAL_TABLET | ORAL | 0 refills | Status: DC

## 2017-10-20 MED ORDER — ALBUTEROL SULFATE HFA 108 (90 BASE) MCG/ACT IN AERS: 2.0000 | INHALATION_SPRAY | Freq: Four times a day (QID) | RESPIRATORY_TRACT | 0 refills | Status: AC | PRN

## 2017-10-20 NOTE — Patient Instructions (Signed)
Start taking Claritin daily.  If you are needing the rescue inhaler outside of exercise then you should follow up.

## 2017-10-20 NOTE — Progress Notes (Signed)
   Subjective:    Patient ID: Alicia Weiss, female    DOB: 05/01/1991, 27 y.o.   MRN: [id number]  HPI Chief Complaint  Patient presents with  . med check    med check. go over migarine meds   She is here for a medcheck. She has a history of migraine headaches, diagnosed 5 years ago.  Migraines have been treated by her neurologist in [place] in the past. States she would like a refill of sumatriptan. States migraines occur 4 times per month. No aura. Occasionally she wakes up with a migraine and this is fairly new. States her psychiatrist would like for her to have a sleep study.  States she takes sumatriptan 100 mg or Maxalt 10 mg. Ibuprofen 600 mg at onset. Maxalt is not as effective as sumatriptan.   Last migraine was last week. States her migraines last from 1-5 days but are not constant.  States she has an appointment in May to see a neurologist. This was delayed because she thought she was moving back to [place] but has decided to stay in [place].   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 She has had a Proair inhaler in the past but has been out for over a year. Requests a new prescription.  Seasonal allergies and has not been treating them.   Denies fever, chills, dizziness, chest pain, palpitations, shortness of breath, abdominal pain, N/V/D, urinary symptoms.   Reviewed allergies, medications, past medical, surgical, family, and social history.       Review of Systems Pertinent positives and negatives in the history of present illness.     Objective:   Physical Exam BP 120/80   Pulse 99   Resp 16   Wt 216 lb 6.4 oz (98.2 kg)   SpO2 99%   BMI 37.73 kg/m   Alert and in no distress.  Pharyngeal area is normal. Neck is supple without adenopathy or thyromegaly. Cardiac exam shows a regular sinus rhythm without murmurs or gallops. Lungs are clear to auscultation.      Assessment & Plan:  Migraine without aura  and without status migrainosus, not intractable - Plan: SUMAtriptan (IMITREX) 100 MG tablet  Asthma, exercise induced - Plan: albuterol (PROVENTIL HFA;VENTOLIN HFA) 108 (90 Base) MCG/ACT inhaler  Seasonal allergies  She is a well-appearing female who does not have a headache today. She is out of her migraine medication.  She has an upcoming neurology appointment to establish with a neurologist here in town [place] from [place].  I will refill sumatriptan instead of Maxalt since this seems to be more effective. History of exercise-induced asthma and has not had an inhaler in over a year.  I will send this to her pharmacy. Recommend she treat her seasonal allergies starting today due to increasing pollen.   She will follow-up as needed.

## 2017-10-29 DIAGNOSIS — G2581 Restless legs syndrome: Secondary | ICD-10-CM | POA: Diagnosis not present

## 2017-10-29 DIAGNOSIS — G478 Other sleep disorders: Secondary | ICD-10-CM | POA: Diagnosis not present

## 2017-10-29 DIAGNOSIS — Z713 Dietary counseling and surveillance: Secondary | ICD-10-CM | POA: Diagnosis not present

## 2017-10-30 DIAGNOSIS — F331 Major depressive disorder, recurrent, moderate: Secondary | ICD-10-CM | POA: Diagnosis not present

## 2017-10-30 DIAGNOSIS — F900 Attention-deficit hyperactivity disorder, predominantly inattentive type: Secondary | ICD-10-CM | POA: Diagnosis not present

## 2017-10-30 DIAGNOSIS — G479 Sleep disorder, unspecified: Secondary | ICD-10-CM | POA: Diagnosis not present

## 2017-10-30 DIAGNOSIS — F419 Anxiety disorder, unspecified: Secondary | ICD-10-CM | POA: Diagnosis not present

## 2017-11-10 ENCOUNTER — Encounter: Payer: Self-pay | Admitting: Internal Medicine

## 2017-11-10 ENCOUNTER — Telehealth: Payer: Self-pay | Admitting: Internal Medicine

## 2017-11-10 NOTE — Telephone Encounter (Signed)
Pt sent over FMLA forms to be filled out  Related to migraines, missing 2 days of work so far- march and April. Vickie has typed up a letter to faxed over to [employer] for pt. And I will also mail back forms and letter to patient as well.

## 2017-12-15 ENCOUNTER — Ambulatory Visit: Payer: BLUE CROSS/BLUE SHIELD | Admitting: Neurology

## 2017-12-15 ENCOUNTER — Encounter: Payer: Self-pay | Admitting: Neurology

## 2017-12-15 VITALS — BP 134/88 | HR 90 | Ht 63.0 in | Wt 215.0 lb

## 2017-12-15 DIAGNOSIS — E669 Obesity, unspecified: Secondary | ICD-10-CM

## 2017-12-15 DIAGNOSIS — G43709 Chronic migraine without aura, not intractable, without status migrainosus: Secondary | ICD-10-CM | POA: Diagnosis not present

## 2017-12-15 DIAGNOSIS — R519 Headache, unspecified: Secondary | ICD-10-CM

## 2017-12-15 DIAGNOSIS — G43009 Migraine without aura, not intractable, without status migrainosus: Secondary | ICD-10-CM | POA: Diagnosis not present

## 2017-12-15 DIAGNOSIS — R51 Headache: Secondary | ICD-10-CM | POA: Diagnosis not present

## 2017-12-15 MED ORDER — KETOROLAC TROMETHAMINE 60 MG/2ML IM SOLN
30.0000 mg | Freq: Once | INTRAMUSCULAR | Status: AC
  Administered 2017-12-15: 30 mg via INTRAMUSCULAR

## 2017-12-15 MED ORDER — SUMATRIPTAN SUCCINATE 100 MG PO TABS: ORAL_TABLET | ORAL | 11 refills | Status: DC

## 2017-12-15 MED ORDER — PROPRANOLOL HCL ER 80 MG PO CP24: 80.0000 mg | ORAL_CAPSULE | Freq: Every day | ORAL | 3 refills | Status: DC

## 2017-12-15 MED ORDER — ONDANSETRON HCL 4 MG PO TABS: 4.0000 mg | ORAL_TABLET | Freq: Three times a day (TID) | ORAL | 11 refills | Status: DC | PRN

## 2017-12-15 NOTE — Progress Notes (Signed)
Toradol 30 mg IM injection given in L deltoid. Pt tolerated well. Bandaid applied. See MAR.  

## 2017-12-15 NOTE — Patient Instructions (Addendum)
Propranolol extended-release capsules What is this medicine? PROPRANOLOL (proe PRAN oh lole) is a beta-blocker. Beta-blockers reduce the workload on the heart and help it to beat more regularly. This medicine is used to treat high blood pressure, heart muscle disease, and prevent chest pain caused by angina. It is also used to prevent migraine headaches. You should not use this medicine to treat a migraine that has already started. This medicine may be used for other purposes; ask your health care provider or pharmacist if you have questions. COMMON BRAND NAME(S): Inderal LA, Inderal XL, InnoPran XL What should I tell my health care provider before I take this medicine? They need to know if you have any of these conditions: -circulation problems, or blood vessel disease -diabetes -history of heart attack or heart disease, vasospastic angina -kidney disease -liver disease -lung or breathing disease, like asthma or emphysema -pheochromocytoma -slow heart rate -thyroid disease -an unusual or allergic reaction to propranolol, other beta-blockers, medicines, foods, dyes, or preservatives -pregnant or trying to get pregnant -breast-feeding How should I use this medicine? Take this medicine by mouth with a glass of water. Follow the directions on the prescription label. Do not crush or chew. Take your doses at regular intervals. Do not take your medicine more often than directed. Do not stop taking except on the advice of your doctor or health care professional. Talk to your pediatrician regarding the use of this medicine in children. Special care may be needed. Overdosage: If you think you have taken too much of this medicine contact a poison control center or emergency room at once. NOTE: This medicine is only for you. Do not share this medicine with others. What if I miss a dose? If you miss a dose, take it as soon as you can. If it is almost time for your next dose, take only that dose. Do  not take double or extra doses. What may interact with this medicine? Do not take this medicine with any of the following medications: -feverfew -phenothiazines like chlorpromazine, mesoridazine, prochlorperazine, thioridazine This medicine may also interact with the following medications: -aluminum hydroxide gel -antipyrine -antiviral medicines for HIV or AIDS -barbiturates like phenobarbital -certain medicines for blood pressure, heart disease, irregular heart beat -cimetidine -ciprofloxacin -diazepam -fluconazole -haloperidol -isoniazid -medicines for cholesterol like cholestyramine or colestipol -medicines for mental depression -medicines for migraine headache like almotriptan, eletriptan, frovatriptan, naratriptan, rizatriptan, sumatriptan, zolmitriptan -NSAIDs, medicines for pain and inflammation, like ibuprofen or naproxen -phenytoin -rifampin -teniposide -theophylline -thyroid medicines -tolbutamide -warfarin -zileuton This list may not describe all possible interactions. Give your health care provider a list of all the medicines, herbs, non-prescription drugs, or dietary supplements you use. Also tell them if you smoke, drink alcohol, or use illegal drugs. Some items may interact with your medicine. What should I watch for while using this medicine? Visit your doctor or health care professional for regular check ups. Contact your doctor right away if your symptoms worsen. Check your blood pressure and pulse rate regularly. Ask your health care professional what your blood pressure and pulse rate should be, and when you should contact them. Do not stop taking this medicine suddenly. This could lead to serious heart-related effects. You may get drowsy or dizzy. Do not drive, use machinery, or do anything that needs mental alertness until you know how this drug affects you. Do not stand or sit up quickly, especially if you are an older patient. This reduces the risk of dizzy or  fainting spells. Alcohol can  make you more drowsy and dizzy. Avoid alcoholic drinks. This medicine can affect blood sugar levels. If you have diabetes, check with your doctor or health care professional before you change your diet or the dose of your diabetic medicine. Do not treat yourself for coughs, colds, or pain while you are taking this medicine without asking your doctor or health care professional for advice. Some ingredients may increase your blood pressure. What side effects may I notice from receiving this medicine? Side effects that you should report to your doctor or health care professional as soon as possible: -allergic reactions like skin rash, itching or hives, swelling of the face, lips, or tongue -breathing problems -changes in blood sugar -cold hands or feet -difficulty sleeping, nightmares -dry peeling skin -hallucinations -muscle cramps or weakness -slow heart rate -swelling of the legs and ankles -vomiting Side effects that usually do not require medical attention (report to your doctor or health care professional if they continue or are bothersome): -change in sex drive or performance -diarrhea -dry sore eyes -hair loss -nausea -weak or tired This list may not describe all possible side effects. Call your doctor for medical advice about side effects. You may report side effects to FDA at 1-800-FDA-1088. Where should I keep my medicine? Keep out of the reach of children. Store at room temperature between 15 and 30 degrees C (59 and 86 degrees F). Protect from light, moisture and freezing. Keep container tightly closed. Throw away any unused medicine after the expiration date. NOTE: This sheet is a summary. It may not cover all possible information. If you have questions about this medicine, talk to your doctor, pharmacist, or health care provider.  2018 Elsevier/Gold Standard (2013-03-24 14:58:56)    Idiopathic Intracranial Hypertension Idiopathic intracranial  hypertension (IIH) is a condition that increases pressure around the brain. The fluid that surrounds the brain and spinal cord (cerebrospinal fluid, CSF) increases and causes the pressure. Idiopathic means that the cause of this condition is not known. IIH affects the brain and spinal cord (is a neurological disorder). If this condition is not treated, it can cause vision loss or blindness. What increases the risk? You are more likely to develop this condition if:  You are severely overweight (obese).  You are a woman who has not gone through menopause.  You take certain medicines, such as birth control or steroids.  What are the signs or symptoms? Symptoms of IIH include:  Headaches. This is the most common symptom.  Pain in the shoulders or neck.  Nausea and vomiting.  A "rushing water" or pulsing sound within the ears (pulsatile tinnitus).  Double vision.  Blurred vision.  Brief episodes of complete vision loss.  How is this diagnosed? This condition may be diagnosed based on:  Your symptoms.  Your medical history.  CT scan of the brain.  MRI of the brain.  Magnetic resonance venogram (MRV) to check veins in the brain.  Diagnostic lumbar puncture. This is a procedure to remove and examine a sample of cerebrospinal fluid. This procedure can determine whether too much fluid may be causing IIH.  A thorough eye exam to check for swelling or nerve damage in the eyes.  How is this treated? Treatment for this condition depends on your symptoms. The goal of treatment is to decrease the pressure around your brain. Common treatments include:  Medicines to decrease the production of spinal fluid and lower the pressure within your skull.  Medicines to prevent or treat headaches.  Surgery to  place drains (shunts) in your brain to remove excess fluid.  Lumbar puncture to remove excess cerebrospinal fluid.  Follow these instructions at home:  If you are overweight or  obese, work with your health care provider to lose weight.  Take over-the-counter and prescription medicines only as told by your health care provider.  Do not drive or use heavy machinery while taking medicines that can make you sleepy.  Keep all follow-up visits as told by your health care provider. This is important. Contact a health care provider if:  You have changes in your vision, such as: ? Double vision. ? Not being able to see colors (color vision). Get help right away if:  You have any of the following symptoms and they get worse or do not get better. ? Headaches. ? Nausea. ? Vomiting. ? Vision changes or difficulty seeing. Summary  Idiopathic intracranial hypertension (IIH) is a condition that increases pressure around the brain. The cause is not known (is idiopathic).  The most common symptom of IIH is headaches.  Treatment may include medicines or surgery to relieve the pressure on your brain. This information is not intended to replace advice given to you by your health care provider. Make sure you discuss any questions you have with your health care provider. Document Released: 09/28/2001 Document Revised: 06/10/2016 Document Reviewed: 06/10/2016 Elsevier Interactive Patient Education  2017 [employer]    Acetazolamide sustained-release capsules What is this medicine? ACETAZOLAMIDE (a set a ZOLE a mide) is used to treat glaucoma. It is also used to treat and to prevent altitude or mountain sickness. This medicine may be used for other purposes; ask your health care provider or pharmacist if you have questions. COMMON BRAND NAME(S): Diamox, Diamox Sequels What should I tell my health care provider before I take this medicine? They need to know if you have any of these conditions: -diabetes -kidney disease -liver disease -lung disease -an unusual or allergic reaction to acetazolamide, sulfa drugs, other medicines, foods, dyes, or preservatives -pregnant or  trying to get pregnant -breast-feeding How should I use this medicine? Take this medicine by mouth with a glass of water. Follow the directions on the prescription label. Take with food or on an empty stomach. Do not crush or chew. Take your doses at regular intervals. Do not take your medicine more often than directed. Do not stop taking except on your doctor's advice. Talk to your pediatrician regarding the use of this medicine in children. Special care may be needed. Patients over 22 years old may have a stronger reaction and need a smaller dose. Overdosage: If you think you have taken too much of this medicine contact a poison control center or emergency room at once. NOTE: This medicine is only for you. Do not share this medicine with others. What if I miss a dose? If you miss a dose, take it as soon as you can. If it is almost time for your next dose, take only that dose. Do not take double or extra doses. What may interact with this medicine? Do not take this medicine with any of the following medications: -methazolamide This medicine may also interact with the following medications: -aspirin and aspirin-like medicines -cyclosporine -lithium -medicine for diabetes -methenamine -other diuretics -phenytoin -primidone -quinidine -sodium bicarbonate -stimulant medicines like dextroamphetamine This list may not describe all possible interactions. Give your health care provider a list of all the medicines, herbs, non-prescription drugs, or dietary supplements you use. Also tell them if you smoke, drink  alcohol, or use illegal drugs. Some items may interact with your medicine. What should I watch for while using this medicine? Visit your doctor or health care professional for regular checks on your progress. You will need blood work done regularly. If you are diabetic, check your blood sugar as directed. You may need to be on a special diet while taking this medicine. Ask your doctor.  Also, ask how many glasses of fluid you need to drink a day. You must not get dehydrated. You may get drowsy or dizzy. Do not drive, use machinery, or do anything that needs mental alertness until you know how this medicine affects you. Do not stand or sit up quickly, especially if you are an older patient. This reduces the risk of dizzy or fainting spells. This medicine can make you more sensitive to the sun. Keep out of the sun. If you cannot avoid being in the sun, wear protective clothing and use sunscreen. Do not use sun lamps or tanning beds/booths. What side effects may I notice from receiving this medicine? Side effects that you should report to your doctor or health care professional as soon as possible: -allergic reactions like skin rash, itching or hives, swelling of the face, lips, or tongue -breathing problems -confusion, depression -dark urine -fever -numbness, tingling in hands or feet -redness, blistering, peeling or loosening of the skin, including inside the mouth -ringing in the ears -seizure -unusually weak or tired -yellowing of the eyes or skin Side effects that usually do not require medical attention (report to your doctor or health care professional if they continue or are bothersome): -change in taste -diarrhea -headache -loss of appetite -nausea, vomiting -passing urine more often This list may not describe all possible side effects. Call your doctor for medical advice about side effects. You may report side effects to FDA at 1-800-FDA-1088. Where should I keep my medicine? Keep out of the reach of children. Store at room temperature between 20 and 25 degrees C (68 and 77 degrees F). Throw away any unused medicine after the expiration date. NOTE: This sheet is a summary. It may not cover all possible information. If you have questions about this medicine, talk to your doctor, pharmacist, or health care provider.  2018 Elsevier/Gold Standard (2007-10-12  11:00:54)    Topiramate tablets What is this medicine? TOPIRAMATE (toe PYRE a mate) is used to treat seizures in adults or children with epilepsy. It is also used for the prevention of migraine headaches. This medicine may be used for other purposes; ask your health care provider or pharmacist if you have questions. COMMON BRAND NAME(S): Topamax, Topiragen What should I tell my health care provider before I take this medicine? They need to know if you have any of these conditions: -bleeding disorders -cirrhosis of the liver or liver disease -diarrhea -glaucoma -kidney stones or kidney disease -low blood counts, like low white cell, platelet, or red cell counts -lung disease like asthma, obstructive pulmonary disease, emphysema -metabolic acidosis -on a ketogenic diet -schedule for surgery or a procedure -suicidal thoughts, plans, or attempt; a previous suicide attempt by you or a family member -an unusual or allergic reaction to topiramate, other medicines, foods, dyes, or preservatives -pregnant or trying to get pregnant -breast-feeding How should I use this medicine? Take this medicine by mouth with a glass of water. Follow the directions on the prescription label. Do not crush or chew. You may take this medicine with meals. Take your medicine at regular intervals. Do not  take it more often than directed. Talk to your pediatrician regarding the use of this medicine in children. Special care may be needed. While this drug may be prescribed for children as young as 32 years of age for selected conditions, precautions do apply. Overdosage: If you think you have taken too much of this medicine contact a poison control center or emergency room at once. NOTE: This medicine is only for you. Do not share this medicine with others. What if I miss a dose? If you miss a dose, take it as soon as you can. If your next dose is to be taken in less than 6 hours, then do not take the missed dose. Take  the next dose at your regular time. Do not take double or extra doses. What may interact with this medicine? Do not take this medicine with any of the following medications: -probenecid This medicine may also interact with the following medications: -acetazolamide -alcohol -amitriptyline -aspirin and aspirin-like medicines -birth control pills -certain medicines for depression -certain medicines for seizures -certain medicines that treat or prevent blood clots like warfarin, enoxaparin, dalteparin, apixaban, dabigatran, and rivaroxaban -digoxin -hydrochlorothiazide -lithium -medicines for pain, sleep, or muscle relaxation -metformin -methazolamide -NSAIDS, medicines for pain and inflammation, like ibuprofen or naproxen -pioglitazone -risperidone This list may not describe all possible interactions. Give your health care provider a list of all the medicines, herbs, non-prescription drugs, or dietary supplements you use. Also tell them if you smoke, drink alcohol, or use illegal drugs. Some items may interact with your medicine. What should I watch for while using this medicine? Visit your doctor or health care professional for regular checks on your progress. Do not stop taking this medicine suddenly. This increases the risk of seizures if you are using this medicine to control epilepsy. Wear a medical identification bracelet or chain to say you have epilepsy or seizures, and carry a card that lists all your medicines. This medicine can decrease sweating and increase your body temperature. Watch for signs of deceased sweating or fever, especially in children. Avoid extreme heat, hot baths, and saunas. Be careful about exercising, especially in hot weather. Contact your health care provider right away if you notice a fever or decrease in sweating. You should drink plenty of fluids while taking this medicine. If you have had kidney stones in the past, this will help to reduce your chances of  forming kidney stones. If you have stomach pain, with nausea or vomiting and yellowing of your eyes or skin, call your doctor immediately. You may get drowsy, dizzy, or have blurred vision. Do not drive, use machinery, or do anything that needs mental alertness until you know how this medicine affects you. To reduce dizziness, do not sit or stand up quickly, especially if you are an older patient. Alcohol can increase drowsiness and dizziness. Avoid alcoholic drinks. If you notice blurred vision, eye pain, or other eye problems, seek medical attention at once for an eye exam. The use of this medicine may increase the chance of suicidal thoughts or actions. Pay special attention to how you are responding while on this medicine. Any worsening of mood, or thoughts of suicide or dying should be reported to your health care professional right away. This medicine may increase the chance of developing metabolic acidosis. If left untreated, this can cause kidney stones, bone disease, or slowed growth in children. Symptoms include breathing fast, fatigue, loss of appetite, irregular heartbeat, or loss of consciousness. Call your doctor immediately if  you experience any of these side effects. Also, tell your doctor about any surgery you plan on having while taking this medicine since this may increase your risk for metabolic acidosis. Birth control pills may not work properly while you are taking this medicine. Talk to your doctor about using an extra method of birth control. Women who become pregnant while using this medicine may enroll in the [place] [employer] Antiepileptic Drug Pregnancy Registry by calling [phone]. This registry collects information about the safety of antiepileptic drug use during pregnancy. What side effects may I notice from receiving this medicine? Side effects that you should report to your doctor or health care professional as soon as possible: -allergic reactions like skin rash,  itching or hives, swelling of the face, lips, or tongue -decreased sweating and/or rise in body temperature -depression -difficulty breathing, fast or irregular breathing patterns -difficulty speaking -difficulty walking or controlling muscle movements -hearing impairment -redness, blistering, peeling or loosening of the skin, including inside the mouth -tingling, pain or numbness in the hands or feet -unusual bleeding or bruising -unusually weak or tired -worsening of mood, thoughts or actions of suicide or dying Side effects that usually do not require medical attention (report to your doctor or health care professional if they continue or are bothersome): -altered taste -back pain, joint or muscle aches and pains -diarrhea, or constipation -headache -loss of appetite -nausea -stomach upset, indigestion -tremors This list may not describe all possible side effects. Call your doctor for medical advice about side effects. You may report side effects to FDA at 1-800-FDA-1088. Where should I keep my medicine? Keep out of the reach of children. Store at room temperature between 15 and 30 degrees C (59 and 86 degrees F) in a tightly closed container. Protect from moisture. Throw away any unused medicine after the expiration date. NOTE: This sheet is a summary. It may not cover all possible information. If you have questions about this medicine, talk to your doctor, pharmacist, or health care provider.  2018 Elsevier/Gold Standard (2013-07-24 23:17:57)

## 2017-12-15 NOTE — Addendum Note (Signed)
Addended by: Bertram Savin on: 12/15/2017 09:04 AM   Modules accepted: Orders

## 2017-12-15 NOTE — Progress Notes (Signed)
[facility]    Provider:  Dr Lucia Gaskins Referring Provider: Avanell Shackleton, NP-C Primary Care Physician:  Avanell Shackleton, NP-C  CC: Migraines  HPI:  Alicia Weiss is a 27 y.o. female here as a referral from Dr. Suezanne Jacquet for migraines. Migraines started 5 years ago. No Fhx of migraines. No inciting event. Recently in the last few months they have worsened. Most of the time she wakes up with a headache (not a migraine). She snores. She is obese. She is having a sleep study. Usually unilateral on the left but can switch, radiates to the neck, pounding, pulsating, photo/phono/osmophobia, nausea no vomiting. Movement makes it worse, a dark room helps. She has 15 migraine days a month, but daily headaches. The morning headaches are new in the last few months. In the morning the headache can be occipital or all over. No numbness, tingling, she feels weak, her eyes hurt and hurts to move them. No aura. They usually last 24 hours if not treated. This frequency and severity for over 6 months, she is missing a lot of work. Hears pulsating in the ears and she will sometimes lose hearing in one ear. A band of pressure can also be felt around the head. No other focal neurologic deficits, associated symptoms, inciting events or modifiable factors. She has had MRIs the last was in 2017. Sister has IIH and a chiari malformation.   meds tried: Gabapentin, imitrex, maxalt, zofran, flexeril, celexa, cymbalta  Reviewed notes, labs and imaging from outside physicians, which showed:  Reviewed referring physician notes.  Patient has a history of migraine headaches diagnosed 5 years ago.  Migraines have been treated by her neurologist in [place] in the past.  She has been on sumatriptan hand for acute management.  States migraines occur 4 times per month.  No aura.  Occasionally she wakes up with a migraine and this is fairly new.  States her psychiatrist would like for her to have a sleep study.  States she  takes sumatriptan 100 mg or Maxalt 10 mg, ibuprofen 600 mg at onset.  Maxalt is not as effective as sumatriptan.  Migraines last from 1 to 5 days but are not constant.  TSH nml 05/2017  Will request MRI report  Review of Systems: Patient complains of symptoms per HPI as well as the following symptoms: memory loss, confusion, headache, . Pertinent negatives and positives per HPI. All others negative.   Social History   Socioeconomic History  . Marital status: Single    Spouse name: Not on file  . Number of children: Not on file  . Years of education: Not on file  . Highest education level: Associate degree: academic program  Occupational History  . Not on file  Social Needs  . Financial resource strain: Not on file  . Food insecurity:    Worry: Not on file    Inability: Not on file  . Transportation needs:    Medical: Not on file    Non-medical: Not on file  Tobacco Use  . Smoking status: Former Smoker    Types: Cigarettes    Start date: 2007    Last attempt to quit: 2011    Years since quitting: 8.3  . Smokeless tobacco: Never Used  . Tobacco comment: "social" when younger   Substance and Sexual Activity  . Alcohol use: Yes    Comment: social, rare  . Drug use: No  . Sexual activity: Yes    Partners: Male    Birth  control/protection: OCP  Lifestyle  . Physical activity:    Days per week: Not on file    Minutes per session: Not on file  . Stress: Not on file  Relationships  . Social connections:    Talks on phone: Not on file    Gets together: Not on file    Attends religious service: Not on file    Active member of club or organization: Not on file    Attends meetings of clubs or organizations: Not on file    Relationship status: Not on file  . Intimate partner violence:    Fear of current or ex partner: Not on file    Emotionally abused: Not on file    Physically abused: Not on file    Forced sexual activity: Not on file  Other Topics Concern  . Not on  file  Social History Narrative   Lives at home with her boyfriend   Right handed   Caffeine: 1 cup daily    Family History  Problem Relation Age of Onset  . Hyperlipidemia Mother   . Hypertension Mother   . Hyperlipidemia Father   . Congestive Heart Failure Maternal Grandmother   . Hypertension Maternal Grandmother   . High Cholesterol Maternal Grandmother   . Thyroid disease Maternal Grandmother     Past Medical History:  Diagnosis Date  . Anxiety and depression   . Asthma, exercise induced 10/20/2017  . GERD (gastroesophageal reflux disease)   . Hyperlipidemia 06/27/2017  . Migraine headache without aura   . Seasonal allergies 10/20/2017    Past Surgical History:  Procedure Laterality Date  . NO PAST SURGERIES      Current Outpatient Medications  Medication Sig Dispense Refill  . albuterol (PROVENTIL HFA;VENTOLIN HFA) 108 (90 Base) MCG/ACT inhaler Inhale 2 puffs into the lungs every 6 (six) hours as needed for wheezing or shortness of breath. 1 Inhaler 0  . Biotin 1000 MCG tablet Take 1,000 mcg by mouth daily.     . Cholecalciferol (VITAMIN D PO) Take 1,000 Units by mouth.    . hydrOXYzine (VISTARIL) 25 MG capsule Take 25 mg by mouth as needed.    . Magnesium Oxide (MAG-OX PO) Take by mouth.    . Multiple Vitamin (MULTIVITAMIN) capsule Take 1 capsule by mouth daily.    . Norethindrone Acet-Ethinyl Est (JUNEL 1.5/30 PO) Take by mouth.    . pantoprazole (PROTONIX) 40 MG tablet Take 40 mg by mouth daily.    . Probiotic Product (PROBIOTIC ADVANCED PO) Take by mouth.    . SUMAtriptan (IMITREX) 100 MG tablet Take 1 tab by mouth as directed as needed for migraine headache*may repeat dose in 2 hours if needed. 10 tablet 11  . Vilazodone HCl (VIIBRYD) 40 MG TABS Take 40 mg by mouth daily.    Marland Kitchen lisdexamfetamine (VYVANSE) 10 MG capsule Take 10 mg by mouth daily.    . ondansetron (ZOFRAN) 4 MG tablet Take 1 tablet (4 mg total) by mouth every 8 (eight) hours as needed for nausea or  vomiting. 20 tablet 11  . propranolol ER (INDERAL LA) 80 MG 24 hr capsule Take 1 capsule (80 mg total) by mouth daily. 30 capsule 3   No current facility-administered medications for this visit.     Allergies as of 12/15/2017  . (No Known Allergies)    Vitals: BP 134/88 (BP Location: Right Arm, Patient Position: Sitting)   Pulse 90   Ht  (1.6 m)   Wt 215 lb (  97.5 kg)   BMI 38.09 kg/m  Last Weight:  Wt Readings from Last 1 Encounters:  12/15/17 215 lb (97.5 kg)   Last Height:   Ht Readings from Last 1 Encounters:  12/15/17  (1.6 m)   Physical exam: Exam: Gen: NAD, conversant, well nourised, obese, well groomed                     CV: RRR, no MRG. No Carotid Bruits. No peripheral edema, warm, nontender Eyes: Conjunctivae clear without exudates or hemorrhage  Neuro: Detailed Neurologic Exam  Speech:    Speech is normal; fluent and spontaneous with normal comprehension.  Cognition:    The patient is oriented to person, place, and time;     recent and remote memory intact;     language fluent;     normal attention, concentration,     fund of knowledge Cranial Nerves:    The pupils are equal, round, and reactive to light. The fundi are normal and spontaneous venous pulsations are present. Visual fields are full to finger confrontation. Extraocular movements are intact. Trigeminal sensation is intact and the muscles of mastication are normal. The face is symmetric. The palate elevates in the midline. Hearing intact. Voice is normal. Shoulder shrug is normal. The tongue has normal motion without fasciculations.   Coordination:    Normal finger to nose and heel to shin. Normal rapid alternating movements.   Gait:    Heel-toe and tandem gait are normal.   Motor Observation:    No asymmetry, no atrophy, and no involuntary movements noted. Tone:    Normal muscle tone.    Posture:    Posture is normal. normal erect    Strength:    Strength is V/V in the upper  and lower limbs.      Sensation: intact to LT     Reflex Exam:  DTR's:    Deep tendon reflexes in the upper and lower extremities are normal bilaterally.   Toes:    The toes are downgoing bilaterally.   Clonus:    Clonus is absent.       Assessment/Plan:  27 year old with a hx of migraines and positional headache  Lumbar puncture to evaluate for opening pressure She is scheduled for a sleep study Obesity: Healthy Weight and Wellness Propranol: for migraine prevention If IIH then start Diamox If neg for IIH initiate Botox and/or Topamax   Discussed: To prevent or relieve headaches, try the following: Cool Compress. Lie down and place a cool compress on your head.  Avoid headache triggers. If certain foods or odors seem to have triggered your migraines in the past, avoid them. A headache diary might help you identify triggers.  Include physical activity in your daily routine. Try a daily walk or other moderate aerobic exercise.  Manage stress. Find healthy ways to cope with the stressors, such as delegating tasks on your to-do list.  Practice relaxation techniques. Try deep breathing, yoga, massage and visualization.  Eat regularly. Eating regularly scheduled meals and maintaining a healthy diet might help prevent headaches. Also, drink plenty of fluids.  Follow a regular sleep schedule. Sleep deprivation might contribute to headaches Consider biofeedback. With this mind-body technique, you learn to control certain bodily functions - such as muscle tension, heart rate and blood pressure - to prevent headaches or reduce headache pain.    Proceed to emergency room if you experience new or worsening symptoms or symptoms do not resolve, if you  have new neurologic symptoms or if headache is severe, or for any concerning symptom.   Provided education and documentation from American headache Society toolbox including articles on: chronic migraine medication overuse headache, chronic  migraines, prevention of migraines, behavioral and other nonpharmacologic treatments for headache.  Orders Placed This Encounter  Procedures  . DG FLUORO GUIDED LOC OF NEEDLE/CATH TIP FOR SPINAL INJECT LT  . Ambulatory referral to [facility]      Naomie Dean, MD  [facility] [address] [place], [place] [address]  Phone [phone] Fax [phone]

## 2017-12-16 DIAGNOSIS — Z0289 Encounter for other administrative examinations: Secondary | ICD-10-CM

## 2017-12-19 ENCOUNTER — Encounter: Payer: Self-pay | Admitting: Neurology

## 2017-12-20 ENCOUNTER — Other Ambulatory Visit: Payer: Self-pay | Admitting: Neurology

## 2017-12-20 MED ORDER — ALPRAZOLAM 0.25 MG PO TABS: ORAL_TABLET | ORAL | 0 refills | Status: DC

## 2017-12-20 NOTE — Telephone Encounter (Signed)
Faxed Xanax prescription to pharmacy. Received a receipt of confirmation.  

## 2017-12-21 ENCOUNTER — Other Ambulatory Visit: Payer: Self-pay | Admitting: Neurology

## 2017-12-21 MED ORDER — ALPRAZOLAM 0.25 MG PO TABS: ORAL_TABLET | ORAL | 0 refills | Status: DC

## 2017-12-22 ENCOUNTER — Ambulatory Visit
Admission: RE | Admit: 2017-12-22 | Discharge: 2017-12-22 | Disposition: A | Discharge status: Home / Self Care | Payer: BLUE CROSS/BLUE SHIELD | Source: Ambulatory Visit | Attending: Neurology | Admitting: Neurology

## 2017-12-22 VITALS — BP 108/56 | HR 70

## 2017-12-22 DIAGNOSIS — R519 Headache, unspecified: Secondary | ICD-10-CM

## 2017-12-22 DIAGNOSIS — R51 Headache: Secondary | ICD-10-CM | POA: Diagnosis not present

## 2017-12-22 DIAGNOSIS — G43709 Chronic migraine without aura, not intractable, without status migrainosus: Secondary | ICD-10-CM | POA: Diagnosis not present

## 2017-12-22 LAB — GLUCOSE, CSF: Glucose, CSF: 57 mg/dL (ref 40–80)

## 2017-12-22 LAB — CSF CELL COUNT WITH DIFFERENTIAL
RBC Count, CSF: 0 cells/uL (ref 0–10)
WBC, CSF: 1 cells/uL (ref 0–5)

## 2017-12-22 LAB — PROTEIN, CSF: Total Protein, CSF: 52 mg/dL — ABNORMAL HIGH (ref 15–45)

## 2017-12-22 NOTE — Discharge Instructions (Signed)

## 2017-12-23 ENCOUNTER — Telehealth: Payer: Self-pay | Admitting: *Deleted

## 2017-12-23 ENCOUNTER — Telehealth: Payer: Self-pay | Admitting: Neurology

## 2017-12-23 DIAGNOSIS — G971 Other reaction to spinal and lumbar puncture: Secondary | ICD-10-CM

## 2017-12-23 NOTE — Telephone Encounter (Signed)
Pt had a lumbar puncture done on yesterday, followed all instructions.Pt states she layed down for 24 hours, she has woke up with head ache and she is feeling nausea.  Pt was advised to call office if she felt this way after following instructions.  Please call

## 2017-12-23 NOTE — Telephone Encounter (Signed)
Spoke with patient. Her request for the ADA form is for intermittent leave for her migraines when they are bad. She stated that she does not qualify for FMLA yet because she has not been at her job for a year. She stated that she was told by her employer that an ADA form would suffice for intermittent leave request due to her migraines. She is aware that Dr. Lucia Gaskins usually provides 3 days of absence per month. She is also aware that there is a section that she needs to fill out with her request and personal information.  Dr. Trevor Mace portion of the ADA forms were completed, signed, and sent to medical records for processing. Office note used as reference for form completion.

## 2017-12-23 NOTE — Addendum Note (Signed)
Addended by: Bertram Savin on: 12/23/2017 02:09 PM   Modules accepted: Orders

## 2017-12-23 NOTE — Telephone Encounter (Signed)
Called pt. She stated that when she is laying down her headache is not that bad, but when she sits up it throbs all over. She can't open her eyes. She is nauseated. She is trying to push fluids and caffeine. RN will d/w Dr. Lucia Gaskins and call pt back. She verbalized appreciation.   Spoke with Dr. Lucia Gaskins. Plan is to order blood patch and possibly start Topiramate.

## 2017-12-23 NOTE — Telephone Encounter (Signed)
Her LP opening pressure was normal so as we discussed I want to start her on Topiramate for her headaches.  fo rone week, then  for one week,  for one week and then . We can call in Topiramate IR.

## 2017-12-23 NOTE — Telephone Encounter (Signed)
Called pt & LVM to inform her that blood patch was ordered. While phone call in process RN noted appt was already scheduled for blood patch tomorrow morning at 7:30 AM. RN informed pt in message that we would be in touch with her soon regarding next steps. Asked for call back if patient has any questions.

## 2017-12-24 ENCOUNTER — Other Ambulatory Visit: Payer: BLUE CROSS/BLUE SHIELD

## 2017-12-24 MED ORDER — TOPIRAMATE 25 MG PO TABS: ORAL_TABLET | ORAL | 0 refills | Status: DC

## 2017-12-24 NOTE — Addendum Note (Signed)
Addended by: Bertram Savin on: 12/24/2017 10:56 AM   Modules accepted: Orders

## 2017-12-24 NOTE — Telephone Encounter (Signed)
Faxed Topiramate prescription. Received a receipt of confirmation.

## 2017-12-24 NOTE — Telephone Encounter (Addendum)
Called and spoke with patient regarding her LP results. She stated that she canceled her blood patch. She took Excedrin last night and the headache went away. It has come back but she did not think she needed the blood patch. RN informed pt that her LP opening pressure was normal and labs fine per Dr. Lucia Gaskins. Dr. Lucia Gaskins would like to start her on Topiramate as previously discussed. She will start  for one week, then  for one week,  for one week and then  daily thereafter. Medication taken at bedtime. We will call prescription into her pharmacy. Patient also aware that the most common side effects are decreased appetite, numbness or tingling in the fingers and toes, sodas may taste flat or funny. Pt was advised that there would be full list of side effects and other information included with the medication. Was also encouraged to call with any concerns or questions about the medication or seek pharmacy assistance. She verbalized appreciation and understanding. Confirmed pt's pharmacy.   Topiramate IR prescription written.

## 2017-12-28 ENCOUNTER — Telehealth: Payer: Self-pay | Admitting: Neurology

## 2017-12-28 ENCOUNTER — Telehealth: Payer: Self-pay | Admitting: *Deleted

## 2017-12-28 NOTE — Telephone Encounter (Signed)
Called pt & LVM (ok per DPR) returning pt's call to see how she is feeling today. Advised pt that order was still in place for blood patch. Asked for call back to discuss. Left office number in message.

## 2017-12-28 NOTE — Telephone Encounter (Signed)
Mother of this  27 year old female patient called to inform me that her daughter had declined a blood patch on Friday for post LP headaches -but HA got worse... Wanted blood patch on weekend, I explained its not an emergency and she will have to call you Tuesday AM to arrange for  It, day 3 post LP is usually peak HA, thus she may have overcome it by now anyway. Keep hydrating, head down and ibuprofen.

## 2017-12-28 NOTE — Telephone Encounter (Signed)
Can you call and discuss with patient thanks

## 2017-12-28 NOTE — Telephone Encounter (Signed)
Lvm unable to reach pt. Pt [employer] form is ready pt need to complete  and sign a page.

## 2018-01-14 ENCOUNTER — Encounter: Payer: Self-pay | Admitting: Neurology

## 2018-03-11 DIAGNOSIS — F419 Anxiety disorder, unspecified: Secondary | ICD-10-CM | POA: Diagnosis not present

## 2018-03-11 DIAGNOSIS — G479 Sleep disorder, unspecified: Secondary | ICD-10-CM | POA: Diagnosis not present

## 2018-03-11 DIAGNOSIS — F900 Attention-deficit hyperactivity disorder, predominantly inattentive type: Secondary | ICD-10-CM | POA: Diagnosis not present

## 2018-03-11 DIAGNOSIS — F331 Major depressive disorder, recurrent, moderate: Secondary | ICD-10-CM | POA: Diagnosis not present

## 2018-03-22 ENCOUNTER — Ambulatory Visit: Payer: BLUE CROSS/BLUE SHIELD | Admitting: Neurology

## 2018-03-30 DIAGNOSIS — Z79899 Other long term (current) drug therapy: Secondary | ICD-10-CM | POA: Diagnosis not present

## 2018-03-30 DIAGNOSIS — F331 Major depressive disorder, recurrent, moderate: Secondary | ICD-10-CM | POA: Diagnosis not present

## 2018-04-15 ENCOUNTER — Telehealth: Payer: Self-pay | Admitting: Neurology

## 2018-04-15 DIAGNOSIS — G479 Sleep disorder, unspecified: Secondary | ICD-10-CM | POA: Diagnosis not present

## 2018-04-15 DIAGNOSIS — F900 Attention-deficit hyperactivity disorder, predominantly inattentive type: Secondary | ICD-10-CM | POA: Diagnosis not present

## 2018-04-15 DIAGNOSIS — F331 Major depressive disorder, recurrent, moderate: Secondary | ICD-10-CM | POA: Diagnosis not present

## 2018-04-15 MED ORDER — PROPRANOLOL HCL ER 80 MG PO CP24: 80.0000 mg | ORAL_CAPSULE | Freq: Every day | ORAL | 3 refills | Status: DC

## 2018-04-15 NOTE — Telephone Encounter (Signed)
Pharmacy stated [facility] ? No note found. I refilled.

## 2018-04-15 NOTE — Telephone Encounter (Signed)
Patient called answering service, stating medication prescribed by Dr Lucia GaskinsAhern was denied.  Not naming medication or date of prescription.Marland Kitchen..Marland Kitchen

## 2018-04-17 NOTE — Telephone Encounter (Signed)
I have send a refill for this migraine preventive medication- did you not intend to renew or were you never the prescriber ? CD

## 2018-04-17 NOTE — Telephone Encounter (Signed)
bethany - any idea?

## 2018-04-18 ENCOUNTER — Other Ambulatory Visit: Payer: Self-pay | Admitting: Neurology

## 2018-04-18 DIAGNOSIS — G4733 Obstructive sleep apnea (adult) (pediatric): Secondary | ICD-10-CM | POA: Diagnosis not present

## 2018-04-18 NOTE — Telephone Encounter (Signed)
I don't see any documentation in her chart of a refusal of a propranolol refill. It was last prescribed in May 2019 with 3 refills. I don't see where the pharmacy reached out to us at all.

## 2018-05-04 ENCOUNTER — Ambulatory Visit (INDEPENDENT_AMBULATORY_CARE_PROVIDER_SITE_OTHER): Payer: BLUE CROSS/BLUE SHIELD | Admitting: Neurology

## 2018-05-04 ENCOUNTER — Telehealth: Payer: Self-pay | Admitting: Neurology

## 2018-05-04 ENCOUNTER — Other Ambulatory Visit: Payer: Self-pay | Admitting: Neurology

## 2018-05-04 ENCOUNTER — Encounter: Payer: Self-pay | Admitting: Neurology

## 2018-05-04 VITALS — BP 117/80 | HR 70 | Ht 63.0 in | Wt 218.0 lb

## 2018-05-04 DIAGNOSIS — G43711 Chronic migraine without aura, intractable, with status migrainosus: Secondary | ICD-10-CM | POA: Diagnosis not present

## 2018-05-04 MED ORDER — PROPRANOLOL HCL 20 MG PO TABS: ORAL_TABLET | ORAL | 0 refills | Status: DC

## 2018-05-04 NOTE — Progress Notes (Signed)
[facility]    Provider:  Dr Lucia Gaskins Referring Provider: Avanell Shackleton, NP-C Primary Care Physician:  Avanell Shackleton, NP-C  CC: Migraines  Interval history 05/04/2018: Patient is here for migraine f/u.  Work-up was negative for intracranial hypertension in fact opening pressure was 12 cmH2O. She had a sleep study pending results. She stopped taking the Topiramate because of side effects. The propranolol has also caused side effects. The migraines improved some with the medication. She has daily headaches, 15 migraine days a month, for close to a year, no aura, no medication overuse, migraines last 24-72 hours and are severe.  Usually unilateral on the left but can switch, radiates to the neck, pounding, pulsating, photo/phono/osmophobia, nausea no vomiting. Movement makes it worse, a dark room helps. She has 15 migraine days a month, but daily headaches.   HPI:  Alicia Weiss is a 27 y.o. female here as a referral from Dr. Suezanne Jacquet for migraines. Migraines started 5 years ago. No Fhx of migraines. No inciting event. Recently in the last few months they have worsened. Most of the time she wakes up with a headache (not a migraine). She snores. She is obese. She is having a sleep study. Usually unilateral on the left but can switch, radiates to the neck, pounding, pulsating, photo/phono/osmophobia, nausea no vomiting. Movement makes it worse, a dark room helps. She has 15 migraine days a month, but daily headaches. The morning headaches are new in the last few months. In the morning the headache can be occipital or all over. No numbness, tingling, she feels weak, her eyes hurt and hurts to move them. No aura. They usually last 24 hours if not treated. This frequency and severity for over 6 months, she is missing a lot of work. Hears pulsating in the ears and she will sometimes lose hearing in one ear. A band of pressure can also be felt around the head. No other focal neurologic deficits,  associated symptoms, inciting events or modifiable factors. She has had MRIs the last was in 2017. Sister has IIH and a chiari malformation.   meds tried: Gabapentin, imitrex, maxalt, zofran, flexeril, celexa, cymbalta  Reviewed notes, labs and imaging from outside physicians, which showed:  Reviewed referring physician notes.  Patient has a history of migraine headaches diagnosed 5 years ago.  Migraines have been treated by her neurologist in [place] in the past.  She has been on sumatriptan hand for acute management.  States migraines occur 4 times per month.  No aura.  Occasionally she wakes up with a migraine and this is fairly new.  States her psychiatrist would like for her to have a sleep study.  States she takes sumatriptan 100 mg or Maxalt 10 mg, ibuprofen 600 mg at onset.  Maxalt is not as effective as sumatriptan.  Migraines last from 1 to 5 days but are not constant.  TSH nml 05/2017  Will request MRI report: Unable to get records, will asked patient to provide [place] the information.  Review of Systems: Patient complains of symptoms per HPI as well as the following symptoms: memory loss, confusion, headache, . Pertinent negatives and positives per HPI. All others negative.   Social History   Socioeconomic History  . Marital status: Single    Spouse name: Not on file  . Number of children: Not on file  . Years of education: Not on file  . Highest education level: Associate degree: academic program  Occupational History  . Not on file  Social Needs  . Financial resource strain: Not on file  . Food insecurity:    Worry: Not on file    Inability: Not on file  . Transportation needs:    Medical: Not on file    Non-medical: Not on file  Tobacco Use  . Smoking status: Former Smoker    Types: Cigarettes    Start date: 2007    Last attempt to quit: 2011    Years since quitting: 8.7  . Smokeless tobacco: Never Used  . Tobacco comment: "social" when younger   Substance and  Sexual Activity  . Alcohol use: Yes    Comment: social, rare  . Drug use: No  . Sexual activity: Yes    Partners: Male    Birth control/protection: Pill  Lifestyle  . Physical activity:    Days per week: Not on file    Minutes per session: Not on file  . Stress: Not on file  Relationships  . Social connections:    Talks on phone: Not on file    Gets together: Not on file    Attends religious service: Not on file    Active member of club or organization: Not on file    Attends meetings of clubs or organizations: Not on file    Relationship status: Not on file  . Intimate partner violence:    Fear of current or ex partner: Not on file    Emotionally abused: Not on file    Physically abused: Not on file    Forced sexual activity: Not on file  Other Topics Concern  . Not on file  Social History Narrative   Lives at home with her boyfriend   Right handed   Caffeine: 1 cup daily    Family History  Problem Relation Age of Onset  . Hyperlipidemia Mother   . Hypertension Mother   . Hyperlipidemia Father   . Congestive Heart Failure Maternal Grandmother   . Hypertension Maternal Grandmother   . High Cholesterol Maternal Grandmother   . Thyroid disease Maternal Grandmother     Past Medical History:  Diagnosis Date  . Anxiety and depression   . Asthma, exercise induced 10/20/2017  . GERD (gastroesophageal reflux disease)   . Hyperlipidemia 06/27/2017  . Migraine headache without aura   . Seasonal allergies 10/20/2017    Past Surgical History:  Procedure Laterality Date  . LUMBAR PUNCTURE  2019  . NO PAST SURGERIES      Current Outpatient Medications  Medication Sig Dispense Refill  . albuterol (PROVENTIL HFA;VENTOLIN HFA) 108 (90 Base) MCG/ACT inhaler Inhale 2 puffs into the lungs every 6 (six) hours as needed for wheezing or shortness of breath. 1 Inhaler 0  . atomoxetine (STRATTERA) 40 MG capsule Take 40 mg by mouth daily.    . Biotin 1000 MCG tablet Take 1,000 mcg  by mouth daily.     . Cholecalciferol (VITAMIN D PO) Take 1,000 Units by mouth.    . hydrOXYzine (VISTARIL) 25 MG capsule Take 25 mg by mouth as needed.    . Magnesium Oxide (MAG-OX PO) Take by mouth.    . Multiple Vitamin (MULTIVITAMIN) capsule Take 1 capsule by mouth daily.    . Norethindrone Acet-Ethinyl Est (JUNEL 1.5/30 PO) Take by mouth.    . ondansetron (ZOFRAN) 4 MG tablet Take 1 tablet (4 mg total) by mouth every 8 (eight) hours as needed for nausea or vomiting. 20 tablet 11  . pantoprazole (PROTONIX) 40 MG tablet Take 40 mg  by mouth daily.    . Probiotic Product (PROBIOTIC ADVANCED PO) Take by mouth.    . propranolol ER (INDERAL LA) 80 MG 24 hr capsule Take 1 capsule (80 mg total) by mouth daily. 30 capsule 3  . SUMAtriptan (IMITREX) 100 MG tablet Take 1 tab by mouth as directed as needed for migraine headache*may repeat dose in 2 hours if needed. 10 tablet 11  . Vilazodone HCl (VIIBRYD) 40 MG TABS Take 40 mg by mouth daily.     No current facility-administered medications for this visit.     Allergies as of 05/04/2018 - Review Complete 05/04/2018  Allergen Reaction Noted  . Topiramate  05/04/2018    Vitals: BP 117/80 (BP Location: Right Arm, Patient Position: Sitting)   Pulse 70   Ht 5\' 3"  (1.6 m)   Wt 218 lb (98.9 kg)   LMP 05/03/2018 (Exact Date)   BMI 38.62 kg/m  Last Weight:  Wt Readings from Last 1 Encounters:  05/04/18 218 lb (98.9 kg)   Last Height:   Ht Readings from Last 1 Encounters:  05/04/18 5\' 3"  (1.6 m)   Physical exam: Exam: Gen: NAD, conversant, well nourised, obese, well groomed                     CV: RRR, no MRG. No Carotid Bruits. No peripheral edema, warm, nontender Eyes: Conjunctivae clear without exudates or hemorrhage  Neuro: Detailed Neurologic Exam  Speech:    Speech is normal; fluent and spontaneous with normal comprehension.  Cognition:    The patient is oriented to person, place, and time;     recent and remote memory intact;       language fluent;     normal attention, concentration,     fund of knowledge Cranial Nerves:    The pupils are equal, round, and reactive to light. The fundi are normal and spontaneous venous pulsations are present. Visual fields are full to finger confrontation. Extraocular movements are intact. Trigeminal sensation is intact and the muscles of mastication are normal. The face is symmetric. The palate elevates in the midline. Hearing intact. Voice is normal. Shoulder shrug is normal. The tongue has normal motion without fasciculations.   Coordination:    Normal finger to nose and heel to shin. Normal rapid alternating movements.   Gait:    Heel-toe and tandem gait are normal.   Motor Observation:    No asymmetry, no atrophy, and no involuntary movements noted. Tone:    Normal muscle tone.    Posture:    Posture is normal. normal erect    Strength:    Strength is V/V in the upper and lower limbs.      Sensation: intact to LT     Reflex Exam:  DTR's:    Deep tendon reflexes in the upper and lower extremities are normal bilaterally.   Toes:    The toes are downgoing bilaterally.   Clonus:    Clonus is absent.     Assessment/Plan:  27 year old with chronic migraines. Could not tolerate Topiramate or propranolol. Negative w/up for IIH.   - Start Botox for chronic intractable migraines - Can discuss CGRP in the future **  Previous workup:   Lumbar puncture to evaluate for opening pressure was normal at 12 She is scheduled for a sleep study Obesity: [facility] was referred at last visit Propranol: for migraine prevention was started at last visit Since IIA H work-up negative she was  started on topiramate for migraines Unable to find MRI report, have asked patient to request report and provided to [place]. Was normal per report.   Discussed: To prevent or relieve headaches, try the following: Cool Compress. Lie down and place a cool compress on your head.   Avoid headache triggers. If certain foods or odors seem to have triggered your migraines in the past, avoid them. A headache diary might help you identify triggers.  Include physical activity in your daily routine. Try a daily walk or other moderate aerobic exercise.  Manage stress. Find healthy ways to cope with the stressors, such as delegating tasks on your to-do list.  Practice relaxation techniques. Try deep breathing, yoga, massage and visualization.  Eat regularly. Eating regularly scheduled meals and maintaining a healthy diet might help prevent headaches. Also, drink plenty of fluids.  Follow a regular sleep schedule. Sleep deprivation might contribute to headaches Consider biofeedback. With this mind-body technique, you learn to control certain bodily functions - such as muscle tension, heart rate and blood pressure - to prevent headaches or reduce headache pain.    Proceed to emergency room if you experience new or worsening symptoms or symptoms do not resolve, if you have new neurologic symptoms or if headache is severe, or for any concerning symptom.   Provided education and documentation from American headache Society toolbox including articles on: chronic migraine medication overuse headache, chronic migraines, prevention of migraines, behavioral and other nonpharmacologic treatments for headache.  A total of 25 minutes was spent face-to-face with this patient. Over half this time was spent on counseling patient on the  1. Chronic migraine without aura, with intractable migraine, so stated, with status migrainosus     diagnosis and different diagnostic and therapeutic options, counseling and coordination of care, risks ans benefits of management, compliance, or risk factor reduction and education.     Naomie Dean, MD  [facility] [address] [place], [place] [address]  Phone [phone] Fax [phone]

## 2018-05-04 NOTE — Telephone Encounter (Signed)
I met with the patient today regarding botox. I went over patient assistance and pharmacy. Paperwork for authorizations and prime enrollment have been given to Dr. Cathren Laine desk.

## 2018-05-04 NOTE — Patient Instructions (Signed)
Start Botox  Erenumab: Patient drug information [employer] Online here. Copyright [phone]) [phone] [employer] All rights reserved. (For additional information see "Erenumab: Drug information") Brand Names: [place]  Aimovig  Brand Names: [place]  Aimovig  What is this drug used for?   It is used to prevent migraine headaches.  What do I need to tell my doctor BEFORE I take this drug?   If you have an allergy to this drug or any part of this drug.   If you are allergic to any drugs like this one, any other drugs, foods, or other substances. Tell your doctor about the allergy and what signs you had, like rash; hives; itching; shortness of breath; wheezing; cough; swelling of face, lips, tongue, or throat; or any other signs.   This drug may interact with other drugs or health problems.   Tell your doctor and pharmacist about all of your drugs (prescription or OTC, natural products, vitamins) and health problems. You must check to make sure that it is safe for you to take this drug with all of your drugs and health problems. Do not start, stop, or change the dose of any drug without checking with your doctor.  What are some things I need to know or do while I take this drug?   Tell all of your health care providers that you take this drug. This includes your doctors, nurses, pharmacists, and dentists.   If you have a latex allergy, talk with your doctor.   Tell your doctor if you are pregnant or plan on getting pregnant. You will need to talk about the benefits and risks of using this drug while you are pregnant.   Tell your doctor if you are breast-feeding. You will need to talk about any risks to your baby.  What are some side effects that I need to call my doctor about right away?   WARNING/CAUTION: Even though it may be rare, some people may have very bad and sometimes deadly side effects when taking a drug. Tell your doctor or get medical help right away if you have any of the following  signs or symptoms that may be related to a very bad side effect:   Signs of an allergic reaction, like rash; hives; itching; red, swollen, blistered, or peeling skin with or without fever; wheezing; tightness in the chest or throat; trouble breathing, swallowing, or talking; unusual hoarseness; or swelling of the mouth, face, lips, tongue, or throat.  What are some other side effects of this drug?   All drugs may cause side effects. However, many people have no side effects or only have minor side effects. Call your doctor or get medical help if any of these side effects or any other side effects bother you or do not go away:   Redness or swelling where the shot is given.   Pain where the shot was given.   Constipation.   These are not all of the side effects that may occur. If you have questions about side effects, call your doctor. Call your doctor for medical advice about side effects.   You may report side effects to your national health agency.  How is this drug best taken?   Use this drug as ordered by your doctor. Read all information given to you. Follow all instructions closely.   It is given as a shot into the fatty part of the skin on the top of the thigh, belly area, or upper arm.   If you  will be giving yourself the shot, your doctor or nurse will teach you how to give the shot.   Follow how to use as you have been told by the doctor or read the package insert.   If stored in a refrigerator, let this drug come to room temperature before using it. Leave it at room temperature for at least 30 minutes. Do not heat this drug.   Protect from heat and sunlight.   Do not shake.   Do not give into skin that is irritated, bruised, red, infected, or scarred.   Do not use if the solution is cloudy, leaking, or has particles.   Do not use if solution changes color.   Throw away after using. Do not use the device more than 1 time.   Throw away needles in a needle/sharp disposal box. Do not  reuse needles or other items. When the box is full, follow all local rules for getting rid of it. Talk with a doctor or pharmacist if you have any questions.  What do I do if I miss a dose?   Take a missed dose as soon as you think about it.   After taking a missed dose, start a new schedule based on when the dose is taken.  How do I store and/or throw out this drug?   Store in a refrigerator. Do not freeze.   Store in the carton to protect from light.   Do not use if it has been frozen.   If you drop this drug on a hard surface, do not use it.   If needed, you may store at room temperature for up to 7 days. Write down the date you take this drug out of the refrigerator. If stored at room temperature and not used within 7 days, throw this drug away.   Do not put this drug back in the refrigerator after it has been stored at room temperature.   Keep all drugs in a safe place. Keep all drugs out of the reach of children and pets.   Throw away unused or expired drugs. Do not flush down a toilet or pour down a drain unless you are told to do so. Check with your pharmacist if you have questions about the best way to throw out drugs. There may be drug take-back programs in your area.  General drug facts   If your symptoms or health problems do not get better or if they become worse, call your doctor.   Do not share your drugs with others and do not take anyone else's drugs.   Keep a list of all your drugs (prescription, natural products, vitamins, OTC) with you. Give this list to your doctor.   Talk with the doctor before starting any new drug, including prescription or OTC, natural products, or vitamins.   Some drugs may have another patient information leaflet. If you have any questions about this drug, please talk with your doctor, nurse, pharmacist, or other health care provider.   If you think there has been an overdose, call your poison control center or get medical care right away. Be ready to  tell or show what was taken, how much, and when it happened.

## 2018-05-04 NOTE — Telephone Encounter (Signed)
Patient confused about propranolol - talked about coming off of it but they are capsules - questions if  calling in a different dose to her pharmacy. Best call back [phone]

## 2018-05-05 NOTE — Telephone Encounter (Signed)
Paperwork faxed to insurance and pharmacy.

## 2018-05-09 NOTE — Telephone Encounter (Signed)
Nurse reviewer from [facility] called to clarify signs and symptoms of the migraine. I read her the clinical information.

## 2018-05-10 DIAGNOSIS — F331 Major depressive disorder, recurrent, moderate: Secondary | ICD-10-CM | POA: Diagnosis not present

## 2018-05-10 DIAGNOSIS — F419 Anxiety disorder, unspecified: Secondary | ICD-10-CM | POA: Diagnosis not present

## 2018-05-10 DIAGNOSIS — G479 Sleep disorder, unspecified: Secondary | ICD-10-CM | POA: Diagnosis not present

## 2018-05-10 DIAGNOSIS — F900 Attention-deficit hyperactivity disorder, predominantly inattentive type: Secondary | ICD-10-CM | POA: Diagnosis not present

## 2018-05-11 DIAGNOSIS — F419 Anxiety disorder, unspecified: Secondary | ICD-10-CM | POA: Diagnosis not present

## 2018-05-11 DIAGNOSIS — Z0130 Encounter for examination of blood pressure without abnormal findings: Secondary | ICD-10-CM | POA: Diagnosis not present

## 2018-05-11 DIAGNOSIS — E669 Obesity, unspecified: Secondary | ICD-10-CM | POA: Diagnosis not present

## 2018-05-11 DIAGNOSIS — G4733 Obstructive sleep apnea (adult) (pediatric): Secondary | ICD-10-CM | POA: Diagnosis not present

## 2018-05-25 NOTE — Telephone Encounter (Signed)
I called and scheduled delivery for the patients medication. DW  

## 2018-05-28 DIAGNOSIS — L74519 Primary focal hyperhidrosis, unspecified: Secondary | ICD-10-CM | POA: Diagnosis not present

## 2018-06-01 ENCOUNTER — Ambulatory Visit (INDEPENDENT_AMBULATORY_CARE_PROVIDER_SITE_OTHER): Payer: BLUE CROSS/BLUE SHIELD | Admitting: Neurology

## 2018-06-01 DIAGNOSIS — G43711 Chronic migraine without aura, intractable, with status migrainosus: Secondary | ICD-10-CM | POA: Diagnosis not present

## 2018-06-01 DIAGNOSIS — M7918 Myalgia, other site: Secondary | ICD-10-CM

## 2018-06-01 MED ORDER — FREMANEZUMAB-VFRM 225 MG/1.5ML ~~LOC~~ SOSY: 225.0000 mg | PREFILLED_SYRINGE | SUBCUTANEOUS | 11 refills | Status: DC

## 2018-06-01 NOTE — Progress Notes (Signed)
Consent Form Botulism Toxin Injection For Chronic Migraine  This is our first botox. Baseline daily headaches. +Ajovy. +LS, +OO, +Masseters, +[place]. She drives from [place] to come to our practice, 3+ hours. Will order dry needling for her cervical myofascial pain, tightness in the traps and other cervical muscles.  Reviewed orally with patient, additionally signature is on file:  Botulism toxin has been approved by the Federal drug administration for treatment of chronic migraine. Botulism toxin does not cure chronic migraine and it may not be effective in some patients.  The administration of botulism toxin is accomplished by injecting a small amount of toxin into the muscles of the neck and head. Dosage must be titrated for each individual. Any benefits resulting from botulism toxin tend to wear off after 3 months with a repeat injection required if benefit is to be maintained. Injections are usually done every 3-4 months with maximum effect peak achieved by about 2 or 3 weeks. Botulism toxin is expensive and you should be sure of what costs you will incur resulting from the injection.  The side effects of botulism toxin use for chronic migraine may include:   -Transient, and usually mild, facial weakness with facial injections  -Transient, and usually mild, head or neck weakness with head/neck injections  -Reduction or loss of forehead facial animation due to forehead muscle weakness  -Eyelid drooping  -Dry eye  -Pain at the site of injection or bruising at the site of injection  -Double vision  -Potential unknown long term risks  Contraindications: You should not have Botox if you are pregnant, nursing, allergic to albumin, have an infection, skin condition, or muscle weakness at the site of the injection, or have myasthenia gravis, Lambert-Eaton syndrome, or ALS.  It is also possible that as with any injection, there may be an allergic reaction or no effect from the medication.  Reduced effectiveness after repeated injections is sometimes seen and rarely infection at the injection site may occur. All care will be taken to prevent these side effects. If therapy is given over a long time, atrophy and wasting in the muscle injected may occur. Occasionally the patient's become refractory to treatment because they develop antibodies to the toxin. In this event, therapy needs to be modified.  I have read the above information and consent to the administration of botulism toxin.    BOTOX PROCEDURE NOTE FOR MIGRAINE HEADACHE    Contraindications and precautions discussed with patient(above). Aseptic procedure was observed and patient tolerated procedure. Procedure performed by Dr. Artemio Aly  The condition has existed for more than 6 months, and pt does not have a diagnosis of ALS, Myasthenia Gravis or Lambert-Eaton Syndrome.  Risks and benefits of injections discussed and pt agrees to proceed with the procedure.  Written consent obtained  These injections are medically necessary. Pt  receives good benefits from these injections. These injections do not cause sedations or hallucinations which the oral therapies may cause.  Description of procedure:  The patient was placed in a sitting position. The standard protocol was used for Botox as follows, with 5 units of Botox injected at each site:   -Procerus muscle, midline injection  -Corrugator muscle, bilateral injection  -Frontalis muscle, bilateral injection, with 2 sites each side, medial injection was performed in the upper one third of the frontalis muscle, in the region vertical from the medial inferior edge of the superior orbital rim. The lateral injection was again in the upper one third of the forehead vertically above  the lateral limbus of the cornea, 1.5 cm lateral to the medial injection site.  - Levator Scapulae: 5 units bilaterally  -Temporalis muscle injection, 5 sites, bilaterally. The first injection was  3 cm above the tragus of the ear, second injection site was 1.5 cm to 3 cm up from the first injection site in line with the tragus of the ear. The third injection site was 1.5-3 cm forward between the first 2 injection sites. The fourth injection site was 1.5 cm posterior to the second injection site. 5th site laterally in the temporalis  muscleat the level of the outer canthus.  - Patient feels her clenching is a trigger for headaches. +5 units masseter bilaterally   - Patient feels the migraines are centered around the eyes +5 units bilaterally at the outer canthus in the orbicularis occuli  -Occipitalis muscle injection, 3 sites, bilaterally. The first injection was done one half way between the occipital protuberance and the tip of the mastoid process behind the ear. The second injection site was done lateral and superior to the first, 1 fingerbreadth from the first injection. The third injection site was 1 fingerbreadth superiorly and medially from the first injection site.  -Cervical paraspinal muscle injection, 2 sites, bilateral knee first injection site was 1 cm from the midline of the cervical spine, 3 cm inferior to the lower border of the occipital protuberance. The second injection site was 1.5 cm superiorly and laterally to the first injection site.  -Trapezius muscle injection was performed at 3 sites, bilaterally. The first injection site was in the upper trapezius muscle halfway between the inflection point of the neck, and the acromion. The second injection site was one half way between the acromion and the first injection site. The third injection was done between the first injection site and the inflection point of the neck.   Will return for repeat injection in 3 months.   A 200 unit sof Botox was used, any Botox not injected was wasted. The patient tolerated the procedure well, there were no complications of the above procedure.

## 2018-06-01 NOTE — Progress Notes (Signed)
Botox consent signed by patient Botox- 100 units x 2 vials Lot: [id number] Expiration: 11/2020 NDC: 4540-9811-91  Bacteriostatic 0.9% Sodium Chloride- 4mL total Lot: [id number] Expiration: 05/04/2019 NDC: 6213-0865-78  Dx: I69.629 S/P

## 2018-06-07 DIAGNOSIS — M542 Cervicalgia: Secondary | ICD-10-CM | POA: Diagnosis not present

## 2018-06-07 DIAGNOSIS — G4733 Obstructive sleep apnea (adult) (pediatric): Secondary | ICD-10-CM | POA: Diagnosis not present

## 2018-06-10 DIAGNOSIS — F419 Anxiety disorder, unspecified: Secondary | ICD-10-CM | POA: Diagnosis not present

## 2018-06-10 DIAGNOSIS — F331 Major depressive disorder, recurrent, moderate: Secondary | ICD-10-CM | POA: Diagnosis not present

## 2018-06-10 DIAGNOSIS — G479 Sleep disorder, unspecified: Secondary | ICD-10-CM | POA: Diagnosis not present

## 2018-06-10 DIAGNOSIS — F900 Attention-deficit hyperactivity disorder, predominantly inattentive type: Secondary | ICD-10-CM | POA: Diagnosis not present

## 2018-06-10 DIAGNOSIS — M542 Cervicalgia: Secondary | ICD-10-CM | POA: Diagnosis not present

## 2018-06-17 DIAGNOSIS — M542 Cervicalgia: Secondary | ICD-10-CM | POA: Diagnosis not present

## 2018-06-21 DIAGNOSIS — M542 Cervicalgia: Secondary | ICD-10-CM | POA: Diagnosis not present

## 2018-06-22 DIAGNOSIS — R030 Elevated blood-pressure reading, without diagnosis of hypertension: Secondary | ICD-10-CM | POA: Diagnosis not present

## 2018-06-22 DIAGNOSIS — G4733 Obstructive sleep apnea (adult) (pediatric): Secondary | ICD-10-CM | POA: Diagnosis not present

## 2018-06-22 DIAGNOSIS — E669 Obesity, unspecified: Secondary | ICD-10-CM | POA: Diagnosis not present

## 2018-06-24 DIAGNOSIS — M542 Cervicalgia: Secondary | ICD-10-CM | POA: Diagnosis not present

## 2018-06-27 DIAGNOSIS — M542 Cervicalgia: Secondary | ICD-10-CM | POA: Diagnosis not present

## 2018-06-28 DIAGNOSIS — M542 Cervicalgia: Secondary | ICD-10-CM | POA: Diagnosis not present

## 2018-07-04 DIAGNOSIS — G4733 Obstructive sleep apnea (adult) (pediatric): Secondary | ICD-10-CM | POA: Diagnosis not present

## 2018-07-04 DIAGNOSIS — M542 Cervicalgia: Secondary | ICD-10-CM | POA: Diagnosis not present

## 2018-07-06 DIAGNOSIS — M542 Cervicalgia: Secondary | ICD-10-CM | POA: Diagnosis not present

## 2018-07-11 DIAGNOSIS — M542 Cervicalgia: Secondary | ICD-10-CM | POA: Diagnosis not present

## 2018-07-13 DIAGNOSIS — F900 Attention-deficit hyperactivity disorder, predominantly inattentive type: Secondary | ICD-10-CM | POA: Diagnosis not present

## 2018-07-13 DIAGNOSIS — F331 Major depressive disorder, recurrent, moderate: Secondary | ICD-10-CM | POA: Diagnosis not present

## 2018-07-13 DIAGNOSIS — F419 Anxiety disorder, unspecified: Secondary | ICD-10-CM | POA: Diagnosis not present

## 2018-07-13 DIAGNOSIS — G479 Sleep disorder, unspecified: Secondary | ICD-10-CM | POA: Diagnosis not present

## 2018-07-13 DIAGNOSIS — M542 Cervicalgia: Secondary | ICD-10-CM | POA: Diagnosis not present

## 2018-07-14 DIAGNOSIS — Z01419 Encounter for gynecological examination (general) (routine) without abnormal findings: Secondary | ICD-10-CM | POA: Diagnosis not present

## 2018-07-14 DIAGNOSIS — Z3041 Encounter for surveillance of contraceptive pills: Secondary | ICD-10-CM | POA: Diagnosis not present

## 2018-07-14 DIAGNOSIS — Z6838 Body mass index (BMI) 38.0-38.9, adult: Secondary | ICD-10-CM | POA: Diagnosis not present

## 2018-07-14 DIAGNOSIS — Z124 Encounter for screening for malignant neoplasm of cervix: Secondary | ICD-10-CM | POA: Diagnosis not present

## 2018-07-22 DIAGNOSIS — M542 Cervicalgia: Secondary | ICD-10-CM | POA: Diagnosis not present

## 2018-07-25 DIAGNOSIS — M542 Cervicalgia: Secondary | ICD-10-CM | POA: Diagnosis not present

## 2018-08-04 DIAGNOSIS — G4733 Obstructive sleep apnea (adult) (pediatric): Secondary | ICD-10-CM | POA: Diagnosis not present

## 2018-09-04 DIAGNOSIS — G4733 Obstructive sleep apnea (adult) (pediatric): Secondary | ICD-10-CM | POA: Diagnosis not present

## 2018-09-05 ENCOUNTER — Ambulatory Visit: Payer: BLUE CROSS/BLUE SHIELD | Admitting: Neurology

## 2018-09-14 ENCOUNTER — Telehealth: Payer: Self-pay | Admitting: Neurology

## 2018-09-14 NOTE — Telephone Encounter (Signed)
I called [facility] at [phone] to check status of scheduling Botox medication for delivery. They stated it is pending insurance verification but it was sent to a team lead and should be done by today.

## 2018-09-19 NOTE — Telephone Encounter (Signed)
I called to check status of the patient medication. It was still pending insurance verification. It is marked stat.

## 2018-09-19 NOTE — Telephone Encounter (Signed)
Botox [id number] (11/01/18)

## 2018-09-20 ENCOUNTER — Ambulatory Visit (INDEPENDENT_AMBULATORY_CARE_PROVIDER_SITE_OTHER): Payer: BLUE CROSS/BLUE SHIELD | Admitting: Neurology

## 2018-09-20 DIAGNOSIS — G43711 Chronic migraine without aura, intractable, with status migrainosus: Secondary | ICD-10-CM | POA: Diagnosis not present

## 2018-09-20 NOTE — Progress Notes (Signed)
Consent Form Botulism Toxin Injection For Chronic Migraine  This is our second botox. Baseline daily headaches, excellent response >> 70% improvement in frequency of headaches and migraines. +Ajovy.  +Masseters,. She drives from [place] to come to our practice, 3+ hours. She had dry needling for her cervical myofascial pain, tightness in the traps and other cervical muscles which helped. Had pain in the cervical muscles after injections, no extra in those muscles. She has been looking down a lot, she liked the dry needling. She completed therapy. She felt the masseters helped with the clenching. Do higher in the frontalis she felt her forehead was not moing. Right eyebrow spocks put 1 unit there. Also 4 units above the eyes total and the rest at the hairline so as not to make the forehead too stiff. Did masseters.   Reviewed orally with patient, additionally signature is on file:  Botulism toxin has been approved by the Federal drug administration for treatment of chronic migraine. Botulism toxin does not cure chronic migraine and it may not be effective in some patients.  The administration of botulism toxin is accomplished by injecting a small amount of toxin into the muscles of the neck and head. Dosage must be titrated for each individual. Any benefits resulting from botulism toxin tend to wear off after 3 months with a repeat injection required if benefit is to be maintained. Injections are usually done every 3-4 months with maximum effect peak achieved by about 2 or 3 weeks. Botulism toxin is expensive and you should be sure of what costs you will incur resulting from the injection.  The side effects of botulism toxin use for chronic migraine may include:   -Transient, and usually mild, facial weakness with facial injections  -Transient, and usually mild, head or neck weakness with head/neck injections  -Reduction or loss of forehead facial animation due to forehead muscle  weakness  -Eyelid drooping  -Dry eye  -Pain at the site of injection or bruising at the site of injection  -Double vision  -Potential unknown long term risks  Contraindications: You should not have Botox if you are pregnant, nursing, allergic to albumin, have an infection, skin condition, or muscle weakness at the site of the injection, or have myasthenia gravis, Lambert-Eaton syndrome, or ALS.  It is also possible that as with any injection, there may be an allergic reaction or no effect from the medication. Reduced effectiveness after repeated injections is sometimes seen and rarely infection at the injection site may occur. All care will be taken to prevent these side effects. If therapy is given over a long time, atrophy and wasting in the muscle injected may occur. Occasionally the patient's become refractory to treatment because they develop antibodies to the toxin. In this event, therapy needs to be modified.  I have read the above information and consent to the administration of botulism toxin.    BOTOX PROCEDURE NOTE FOR MIGRAINE HEADACHE    Contraindications and precautions discussed with patient(above). Aseptic procedure was observed and patient tolerated procedure. Procedure performed by Dr. Artemio Aly  The condition has existed for more than 6 months, and pt does not have a diagnosis of ALS, Myasthenia Gravis or Lambert-Eaton Syndrome.  Risks and benefits of injections discussed and pt agrees to proceed with the procedure.  Written consent obtained  These injections are medically necessary. Pt  receives good benefits from these injections. These injections do not cause sedations or hallucinations which the oral therapies may cause.  Description of  procedure:  The patient was placed in a sitting position. The standard protocol was used for Botox as follows, with 5 units of Botox injected at each site:   -Procerus muscle, midline injection  -Corrugator muscle, bilateral  injection  -Frontalis muscle, bilateral injection, with 2 sites each side, medial injection was performed in the upper one third of the frontalis muscle, in the region vertical from the medial inferior edge of the superior orbital rim. The lateral injection was again in the upper one third of the forehead vertically above the lateral limbus of the cornea, 1.5 cm lateral to the medial injection site.  - Levator Scapulae: 5 units bilaterally  -Temporalis muscle injection, 5 sites, bilaterally. The first injection was 3 cm above the tragus of the ear, second injection site was 1.5 cm to 3 cm up from the first injection site in line with the tragus of the ear. The third injection site was 1.5-3 cm forward between the first 2 injection sites. The fourth injection site was 1.5 cm posterior to the second injection site.   - Patient feels her clenching is a trigger for headaches. +5 units masseter bilaterally   -Occipitalis muscle injection, 3 sites, bilaterally. The first injection was done one half way between the occipital protuberance and the tip of the mastoid process behind the ear. The second injection site was done lateral and superior to the first, 1 fingerbreadth from the first injection. The third injection site was 1 fingerbreadth superiorly and medially from the first injection site.  -Cervical paraspinal muscle injection, 2 sites, bilateral knee first injection site was 1 cm from the midline of the cervical spine, 3 cm inferior to the lower border of the occipital protuberance. The second injection site was 1.5 cm superiorly and laterally to the first injection site.  -Trapezius muscle injection was performed at 3 sites, bilaterally. The first injection site was in the upper trapezius muscle halfway between the inflection point of the neck, and the acromion. The second injection site was one half way between the acromion and the first injection site. The third injection was done between the first  injection site and the inflection point of the neck.   Will return for repeat injection in 3 months.   A 200 unit sof Botox was used, any Botox not injected was wasted. The patient tolerated the procedure well, there were no complications of the above procedure.

## 2018-09-20 NOTE — Progress Notes (Signed)
Botox- 100 units x 2 vials Lot: [id number] Expiration: 03/2021 NDC: 0023-1145-01  Bacteriostatic 0.9% Sodium Chloride- 4mL total Lot: [id number] Expiration: 05/04/2019 NDC: 0409-1966-02  Dx: G43.711 B/B   

## 2018-09-20 NOTE — Telephone Encounter (Signed)
Patient changed to B/B. DW

## 2018-09-24 DIAGNOSIS — F419 Anxiety disorder, unspecified: Secondary | ICD-10-CM | POA: Diagnosis not present

## 2018-09-24 DIAGNOSIS — F4310 Post-traumatic stress disorder, unspecified: Secondary | ICD-10-CM | POA: Diagnosis not present

## 2018-09-24 DIAGNOSIS — F900 Attention-deficit hyperactivity disorder, predominantly inattentive type: Secondary | ICD-10-CM | POA: Diagnosis not present

## 2018-09-24 DIAGNOSIS — F331 Major depressive disorder, recurrent, moderate: Secondary | ICD-10-CM | POA: Diagnosis not present

## 2018-10-03 DIAGNOSIS — G4733 Obstructive sleep apnea (adult) (pediatric): Secondary | ICD-10-CM | POA: Diagnosis not present

## 2018-11-03 DIAGNOSIS — G4733 Obstructive sleep apnea (adult) (pediatric): Secondary | ICD-10-CM | POA: Diagnosis not present

## 2018-12-03 DIAGNOSIS — F900 Attention-deficit hyperactivity disorder, predominantly inattentive type: Secondary | ICD-10-CM | POA: Diagnosis not present

## 2018-12-03 DIAGNOSIS — G4733 Obstructive sleep apnea (adult) (pediatric): Secondary | ICD-10-CM | POA: Diagnosis not present

## 2018-12-03 DIAGNOSIS — F419 Anxiety disorder, unspecified: Secondary | ICD-10-CM | POA: Diagnosis not present

## 2018-12-03 DIAGNOSIS — F331 Major depressive disorder, recurrent, moderate: Secondary | ICD-10-CM | POA: Diagnosis not present

## 2018-12-03 DIAGNOSIS — F4310 Post-traumatic stress disorder, unspecified: Secondary | ICD-10-CM | POA: Diagnosis not present

## 2018-12-07 ENCOUNTER — Telehealth: Payer: Self-pay | Admitting: Neurology

## 2018-12-07 NOTE — Telephone Encounter (Signed)
It looks like the 2:30 will be available on the 19th.

## 2018-12-07 NOTE — Telephone Encounter (Signed)
How about 21st so we dont have 10 people in one day? The 21st looks empty

## 2018-12-07 NOTE — Telephone Encounter (Signed)
I called to offer the patient th 21st but she did not answer I left a VM asking her to call back and sent a mychart message confirming this apt was ok. DW

## 2018-12-07 NOTE — Telephone Encounter (Signed)
I spoke with this patient today regarding scheduling her Botox injection. She stated that she was told she could come back 2.5 months between each injection instead of waiting the full 3 months. I offered her an apt for the 26th of this month (She is not due until the 19th) but she stated that she could not make it to that apt because she will be back at work and she would like to come in on the 19th. She lives 3 hours away and says if we cant see her on the 19th she may have to find a neurologist closer to her home. Please advise.

## 2018-12-08 DIAGNOSIS — I10 Essential (primary) hypertension: Secondary | ICD-10-CM | POA: Diagnosis not present

## 2018-12-08 DIAGNOSIS — F3341 Major depressive disorder, recurrent, in partial remission: Secondary | ICD-10-CM | POA: Diagnosis not present

## 2018-12-08 DIAGNOSIS — E782 Mixed hyperlipidemia: Secondary | ICD-10-CM | POA: Diagnosis not present

## 2018-12-08 DIAGNOSIS — F419 Anxiety disorder, unspecified: Secondary | ICD-10-CM | POA: Diagnosis not present

## 2018-12-20 NOTE — Telephone Encounter (Signed)
Patient has an apt coming up, [employer] is not covering the BOTOX medication because she is currently on Ajovy CGRP. I told her I was going to touch base with Dr. Lucia Gaskins and the patient would like to discuss a course of action from this point. I am checking on pricing for out of pocket.

## 2018-12-21 NOTE — Telephone Encounter (Signed)
Out of pocket cost will be near 1,000 dollars. It does not appear financial assistance would be available through [employer]. Please advise.

## 2018-12-21 NOTE — Telephone Encounter (Signed)
I called the patient to change apt but she did not answer so I left a VM asking her to call me back. DW

## 2018-12-21 NOTE — Telephone Encounter (Signed)
I would set up a doxy visit with me to discuss thanks

## 2018-12-22 ENCOUNTER — Encounter: Payer: Self-pay | Admitting: Neurology

## 2018-12-22 ENCOUNTER — Encounter: Payer: Self-pay | Admitting: *Deleted

## 2018-12-22 ENCOUNTER — Ambulatory Visit (INDEPENDENT_AMBULATORY_CARE_PROVIDER_SITE_OTHER): Payer: BLUE CROSS/BLUE SHIELD | Admitting: Neurology

## 2018-12-22 ENCOUNTER — Other Ambulatory Visit: Payer: Self-pay

## 2018-12-22 DIAGNOSIS — E782 Mixed hyperlipidemia: Secondary | ICD-10-CM | POA: Diagnosis not present

## 2018-12-22 DIAGNOSIS — G4733 Obstructive sleep apnea (adult) (pediatric): Secondary | ICD-10-CM

## 2018-12-22 DIAGNOSIS — E538 Deficiency of other specified B group vitamins: Secondary | ICD-10-CM | POA: Diagnosis not present

## 2018-12-22 DIAGNOSIS — T7840XA Allergy, unspecified, initial encounter: Secondary | ICD-10-CM

## 2018-12-22 DIAGNOSIS — M7918 Myalgia, other site: Secondary | ICD-10-CM | POA: Diagnosis not present

## 2018-12-22 DIAGNOSIS — G43709 Chronic migraine without aura, not intractable, without status migrainosus: Secondary | ICD-10-CM

## 2018-12-22 DIAGNOSIS — I10 Essential (primary) hypertension: Secondary | ICD-10-CM | POA: Diagnosis not present

## 2018-12-22 DIAGNOSIS — Z9989 Dependence on other enabling machines and devices: Secondary | ICD-10-CM

## 2018-12-22 DIAGNOSIS — J302 Other seasonal allergic rhinitis: Secondary | ICD-10-CM

## 2018-12-22 DIAGNOSIS — K760 Fatty (change of) liver, not elsewhere classified: Secondary | ICD-10-CM | POA: Diagnosis not present

## 2018-12-22 MED ORDER — METHOCARBAMOL 750 MG PO TABS: 750.0000 mg | ORAL_TABLET | Freq: Three times a day (TID) | ORAL | 3 refills | Status: DC

## 2018-12-22 MED ORDER — AZELASTINE HCL 0.1 % NA SOLN: 2.0000 | Freq: Two times a day (BID) | NASAL | 12 refills | Status: AC

## 2018-12-22 NOTE — Progress Notes (Signed)
[facility]    Provider:  Dr Lucia Gaskins Referring Provider: Avanell Shackleton, NP-C Primary Care Physician:  Avanell Shackleton, NP-C  CC: Migraines  Virtual Visit via Video Note  I connected with [name] on 12/23/18 at  3:30 PM EDT by a video enabled telemedicine application and verified that I am speaking with the correct person using two identifiers.  Location: Patient: home Provider: office   I discussed the limitations of evaluation and management by telemedicine and the availability of in person appointments. The patient expressed understanding and agreed to proceed.   Follow Up Instructions:    I discussed the assessment and treatment plan with the patient. The patient was provided an opportunity to ask questions and all were answered. The patient agreed with the plan and demonstrated an understanding of the instructions.   The patient was advised to call back or seek an in-person evaluation if the symptoms worsen or if the condition fails to improve as anticipated.  I provided 40 minutes of non-face-to-face time during this encounter.   Anson Fret, MD   Interval history 12/22/2018: She tried dry needling and went to PT. Didn't really helped. She has a lot of tension in her neck. She has mild sleep apnea and has not been using cpap due to congestion and sinus problems. She has allergies right now. Migraines are better. More neck tightness. 5 days migraines. 15 total days headache, improved from daily.    Interval history 05/04/2018: Patient is here for migraine f/u.  Work-up was negative for intracranial hypertension in fact opening pressure was 12 cmH2O. She had a sleep study pending results. She stopped taking the Topiramate because of side effects. The propranolol has also caused side effects. The migraines improved some with the medication. She has daily headaches, 15 migraine days a month, for close to a year, no aura, no medication overuse,  migraines last 24-72 hours and are severe.  Usually unilateral on the left but can switch, radiates to the neck, pounding, pulsating, photo/phono/osmophobia, nausea no vomiting. Movement makes it worse, a dark room helps. She has 15 migraine days a month, but daily headaches.   HPI:  Alicia Weiss is a 28 y.o. female here as a referral from Dr. Suezanne Jacquet for migraines. Migraines started 5 years ago. No Fhx of migraines. No inciting event. Recently in the last few months they have worsened. Most of the time she wakes up with a headache (not a migraine). She snores. She is obese. She is having a sleep study. Usually unilateral on the left but can switch, radiates to the neck, pounding, pulsating, photo/phono/osmophobia, nausea no vomiting. Movement makes it worse, a dark room helps. She has 15 migraine days a month, but daily headaches. The morning headaches are new in the last few months. In the morning the headache can be occipital or all over. No numbness, tingling, she feels weak, her eyes hurt and hurts to move them. No aura. They usually last 24 hours if not treated. This frequency and severity for over 6 months, she is missing a lot of work. Hears pulsating in the ears and she will sometimes lose hearing in one ear. A band of pressure can also be felt around the head. No other focal neurologic deficits, associated symptoms, inciting events or modifiable factors. She has had MRIs the last was in 2017. Sister has IIH and a chiari malformation.   meds tried: Gabapentin, imitrex, maxalt, zofran, flexeril, celexa, cymbalta  Reviewed notes, labs  and imaging from outside physicians, which showed:  Reviewed referring physician notes.  Patient has a history of migraine headaches diagnosed 5 years ago.  Migraines have been treated by her neurologist in [place] in the past.  She has been on sumatriptan hand for acute management.  States migraines occur 4 times per month.  No aura.  Occasionally she wakes up with a  migraine and this is fairly new.  States her psychiatrist would like for her to have a sleep study.  States she takes sumatriptan 100 mg or Maxalt 10 mg, ibuprofen 600 mg at onset.  Maxalt is not as effective as sumatriptan.  Migraines last from 1 to 5 days but are not constant.  TSH nml 05/2017  Will request MRI report: Unable to get records, will asked patient to provide [place] the information.  Review of Systems: Patient complains of symptoms per HPI as well as the following symptoms: memory loss, confusion, headache, . Pertinent negatives and positives per HPI. All others negative.   Social History   Socioeconomic History  . Marital status: Single    Spouse name: Not on file  . Number of children: Not on file  . Years of education: Not on file  . Highest education level: Associate degree: academic program  Occupational History  . Not on file  Social Needs  . Financial resource strain: Not on file  . Food insecurity:    Worry: Not on file    Inability: Not on file  . Transportation needs:    Medical: Not on file    Non-medical: Not on file  Tobacco Use  . Smoking status: Former Smoker    Types: Cigarettes    Start date: 2007    Last attempt to quit: 2011    Years since quitting: 9.3  . Smokeless tobacco: Never Used  . Tobacco comment: "social" when younger   Substance and Sexual Activity  . Alcohol use: Yes    Comment: social, rare  . Drug use: No  . Sexual activity: Yes    Partners: Male    Birth control/protection: Pill  Lifestyle  . Physical activity:    Days per week: Not on file    Minutes per session: Not on file  . Stress: Not on file  Relationships  . Social connections:    Talks on phone: Not on file    Gets together: Not on file    Attends religious service: Not on file    Active member of club or organization: Not on file    Attends meetings of clubs or organizations: Not on file    Relationship status: Not on file  . Intimate partner violence:     Fear of current or ex partner: Not on file    Emotionally abused: Not on file    Physically abused: Not on file    Forced sexual activity: Not on file  Other Topics Concern  . Not on file  Social History Narrative   Lives at home with her boyfriend   Right handed   Caffeine: 1 cup daily    Family History  Problem Relation Age of Onset  . Hyperlipidemia Mother   . Hypertension Mother   . Hyperlipidemia Father   . Congestive Heart Failure Maternal Grandmother   . Hypertension Maternal Grandmother   . High Cholesterol Maternal Grandmother   . Thyroid disease Maternal Grandmother     Past Medical History:  Diagnosis Date  . Anxiety and depression   . Asthma, exercise  induced 10/20/2017  . GERD (gastroesophageal reflux disease)   . Hyperlipidemia 06/27/2017  . Migraine headache without aura   . Seasonal allergies 10/20/2017    Past Surgical History:  Procedure Laterality Date  . LUMBAR PUNCTURE  2019  . NO PAST SURGERIES      Current Outpatient Medications  Medication Sig Dispense Refill  . albuterol (PROVENTIL HFA;VENTOLIN HFA) 108 (90 Base) MCG/ACT inhaler Inhale 2 puffs into the lungs every 6 (six) hours as needed for wheezing or shortness of breath. 1 Inhaler 0  . atomoxetine (STRATTERA) 25 MG capsule Take 25 mg by mouth daily.    . Biotin 1000 MCG tablet Take 1,000 mcg by mouth daily.     . Cholecalciferol (VITAMIN D PO) Take 1,000 Units by mouth.    . Fremanezumab-vfrm (AJOVY) 225 MG/1.5ML SOSY Inject 225 mg into the skin every 30 (thirty) days. 1 Syringe 11  . hydrOXYzine (ATARAX/VISTARIL) 10 MG tablet Take 10 mg by mouth daily as needed.    . hydrOXYzine (VISTARIL) 25 MG capsule Take 25 mg by mouth as needed.    Colleen Can FE 1.5/30 1.5-30 MG-MCG tablet Take 1 tablet by mouth daily.    . Magnesium Oxide (MAG-OX PO) Take by mouth.    . Multiple Vitamin (MULTIVITAMIN) capsule Take 1 capsule by mouth daily.    . ondansetron (ZOFRAN) 4 MG tablet Take 1 tablet (4 mg total)  by mouth every 8 (eight) hours as needed for nausea or vomiting. 20 tablet 11  . pantoprazole (PROTONIX) 40 MG tablet Take 40 mg by mouth daily.    . SUMAtriptan (IMITREX) 100 MG tablet Take 1 tab by mouth as directed as needed for migraine headache*may repeat dose in 2 hours if needed. 10 tablet 11  . Vilazodone HCl (VIIBRYD) 40 MG TABS Take 40 mg by mouth daily.     No current facility-administered medications for this visit.     Allergies as of 12/22/2018 - Review Complete 12/22/2018  Allergen Reaction Noted  . Topiramate  05/04/2018    Vitals: There were no vitals taken for this visit. Last Weight:  Wt Readings from Last 1 Encounters:  12/22/18 209 lb (94.8 kg)   Last Height:   Ht Readings from Last 1 Encounters:  12/22/18  (1.6 m)   Physical exam: Exam: Gen: NAD, conversant, well nourised, obese, well groomed                     CV: RRR, no MRG. No Carotid Bruits. No peripheral edema, warm, nontender Eyes: Conjunctivae clear without exudates or hemorrhage  Neuro: Detailed Neurologic Exam  Speech:    Speech is normal; fluent and spontaneous with normal comprehension.  Cognition:    The patient is oriented to person, place, and time;     recent and remote memory intact;     language fluent;     normal attention, concentration,     fund of knowledge Cranial Nerves:    The pupils are equal, round, and reactive to light. The fundi are normal and spontaneous venous pulsations are present. Visual fields are full to finger confrontation. Extraocular movements are intact. Trigeminal sensation is intact and the muscles of mastication are normal. The face is symmetric. The palate elevates in the midline. Hearing intact. Voice is normal. Shoulder shrug is normal. The tongue has normal motion without fasciculations.   Coordination:    Normal finger to nose and heel to shin. Normal rapid alternating movements.   Gait:  Heel-toe and tandem gait are normal.   Motor  Observation:    No asymmetry, no atrophy, and no involuntary movements noted. Tone:    Normal muscle tone.    Posture:    Posture is normal. normal erect    Strength:    Strength is V/V in the upper and lower limbs.      Sensation: intact to LT     Reflex Exam:  DTR's:    Deep tendon reflexes in the upper and lower extremities are normal bilaterally.   Toes:    The toes are downgoing bilaterally.   Clonus:    Clonus is absent.     Assessment/Plan:  28 year old with chronic migraines. Could not tolerate Topiramate or propranolol. Negative w/up for IIH.  Doing well on CGRP.  She was getting Botox however insurance declined it and she feels stable as far as her migraines with CGRP only.  Her current issue is myofascial neck pain causing cervicalgia.  Migraines improved, continue CGRP injection.imitrex acutely.  Obstructive sleep apnea: Diagnosed with mild obstructive sleep apnea, not using CPAP due to congestion.  Discussed compliance, encouraged follow-up with sleep doctor, discussed sequelae of untreated sleep apnea including worsening headaches, strokes, cardiovascular disease and other.  We will try Astelin for her, advised to get Flonase over-the-counter, however since this is not 1 of my specialties I do recommend follow-up with primary care, allergist and/or ENT.  Also Nettie pot may be helpful. Cervical myofascial pain syndrome and cervicalgia: Try Robaxin.  Dry needling did not help.  Recommended heat, ice, topical agents, massage, stretching and if she finds it helpful possibly chiropractic. 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  Happy to provide her a letter for accommodations, she will email me with request.    Meds ordered this encounter  Medications  . azelastine (ASTELIN) 0.1 % nasal spray    Sig: Place 2 sprays into both nostrils 2 (two) times daily. Use in each nostril as directed    Dispense:  30 mL    Refill:  12  . methocarbamol (ROBAXIN-750) 750 MG  tablet    Sig: Take 1 tablet (750 mg total) by mouth 3 (three) times daily.    Dispense:  90 tablet    Refill:  3      Previous workup:   Lumbar puncture to evaluate for opening pressure was normal at 12 She is scheduled for a sleep study Obesity: [facility] was referred at last visit Propranol: for migraine prevention was started at last visit Since IIA H work-up negative she was started on topiramate for migraines Unable to find MRI report, have asked patient to request report and provided to us. Was normal per report.   Discussed: To prevent or relieve headaches, try the following: Cool Compress. Lie down and place a cool compress on your head.  Avoid headache triggers. If certain foods or odors seem to have triggered your migraines in the past, avoid them. A headache diary might help you identify triggers.  Include physical activity in your daily routine. Try a daily walk or other moderate aerobic exercise.  Manage stress. Find healthy ways to cope with the stressors, such as delegating tasks on your to-do list.  Practice relaxation techniques. Try deep breathing, yoga, massage and visualization.  Eat regularly. Eating regularly scheduled meals and maintaining a healthy diet might help prevent headaches. Also, drink plenty of fluids.  Follow a regular sleep schedule. Sleep deprivation might contribute to headaches Consider biofeedback.  With this mind-body technique, you learn to control certain bodily functions - such as muscle tension, heart rate and blood pressure - to prevent headaches or reduce headache pain.    Proceed to emergency room if you experience new or worsening symptoms or symptoms do not resolve, if you have new neurologic symptoms or if headache is severe, or for any concerning symptom.   Provided education and documentation from American headache Society toolbox including articles on: chronic migraine medication overuse headache, chronic  migraines, prevention of migraines, behavioral and other nonpharmacologic treatments for headache.  A total of 25 minutes was spent face-to-face with this patient. Over half this time was spent on counseling patient on the  No diagnosis found.  diagnosis and different diagnostic and therapeutic options, counseling and coordination of care, risks ans benefits of management, compliance, or risk factor reduction and education.     Naomie Dean, MD  [facility] [address] [place], [place] [address]  Phone [phone] Fax [phone]

## 2018-12-22 NOTE — Telephone Encounter (Signed)
Spoke with pt and offered to convert her appt to doxy virtual visit so she and Dr. Lucia Gaskins could discuss the plan going forward since botox was denied. Pt verbalized understanding and provided consent to the doxy visit. She understands the limitations to a physical exam on doxy and provides consent to file the visit with insurance. Pt is aware to sign into the meeting 5-10 minutes before her visit and she will need to enter her first & last name into the virtual waiting area. Will keep same time of 3:30 pm today. She confirmed her name & DOB and reviewed her chart including history & medications. Pt understands a link will be texted to her phone which she plans to use. Her carrier is [employer]. Cell number confirmed. Pt advised to send mychart message or call if she does not get the link within the next 30 minutes. Pt verbalized appreciation.   Link sent through [email or web address] to pt @ [phone].

## 2018-12-23 DIAGNOSIS — Z9989 Dependence on other enabling machines and devices: Secondary | ICD-10-CM | POA: Insufficient documentation

## 2018-12-23 DIAGNOSIS — M7918 Myalgia, other site: Secondary | ICD-10-CM | POA: Insufficient documentation

## 2018-12-23 DIAGNOSIS — G4733 Obstructive sleep apnea (adult) (pediatric): Secondary | ICD-10-CM | POA: Insufficient documentation

## 2019-01-07 DIAGNOSIS — F4310 Post-traumatic stress disorder, unspecified: Secondary | ICD-10-CM | POA: Diagnosis not present

## 2019-01-07 DIAGNOSIS — F419 Anxiety disorder, unspecified: Secondary | ICD-10-CM | POA: Diagnosis not present

## 2019-01-07 DIAGNOSIS — F331 Major depressive disorder, recurrent, moderate: Secondary | ICD-10-CM | POA: Diagnosis not present

## 2019-01-07 DIAGNOSIS — F900 Attention-deficit hyperactivity disorder, predominantly inattentive type: Secondary | ICD-10-CM | POA: Diagnosis not present

## 2019-01-16 NOTE — Telephone Encounter (Signed)
Error

## 2019-02-16 DIAGNOSIS — I340 Nonrheumatic mitral (valve) insufficiency: Secondary | ICD-10-CM | POA: Diagnosis not present

## 2019-02-16 DIAGNOSIS — I341 Nonrheumatic mitral (valve) prolapse: Secondary | ICD-10-CM | POA: Diagnosis not present

## 2019-02-16 DIAGNOSIS — Z6838 Body mass index (BMI) 38.0-38.9, adult: Secondary | ICD-10-CM | POA: Diagnosis not present

## 2019-02-16 DIAGNOSIS — I10 Essential (primary) hypertension: Secondary | ICD-10-CM | POA: Diagnosis not present

## 2019-02-18 DIAGNOSIS — F900 Attention-deficit hyperactivity disorder, predominantly inattentive type: Secondary | ICD-10-CM | POA: Diagnosis not present

## 2019-02-18 DIAGNOSIS — F419 Anxiety disorder, unspecified: Secondary | ICD-10-CM | POA: Diagnosis not present

## 2019-02-18 DIAGNOSIS — E782 Mixed hyperlipidemia: Secondary | ICD-10-CM | POA: Diagnosis not present

## 2019-02-18 DIAGNOSIS — F331 Major depressive disorder, recurrent, moderate: Secondary | ICD-10-CM | POA: Diagnosis not present

## 2019-03-09 DIAGNOSIS — R002 Palpitations: Secondary | ICD-10-CM | POA: Diagnosis not present

## 2019-03-09 DIAGNOSIS — I081 Rheumatic disorders of both mitral and tricuspid valves: Secondary | ICD-10-CM | POA: Diagnosis not present

## 2019-03-09 DIAGNOSIS — I340 Nonrheumatic mitral (valve) insufficiency: Secondary | ICD-10-CM | POA: Diagnosis not present

## 2019-03-13 DIAGNOSIS — I340 Nonrheumatic mitral (valve) insufficiency: Secondary | ICD-10-CM | POA: Diagnosis not present

## 2019-03-13 DIAGNOSIS — I081 Rheumatic disorders of both mitral and tricuspid valves: Secondary | ICD-10-CM | POA: Diagnosis not present

## 2019-03-13 DIAGNOSIS — R002 Palpitations: Secondary | ICD-10-CM | POA: Diagnosis not present

## 2019-03-23 DIAGNOSIS — I10 Essential (primary) hypertension: Secondary | ICD-10-CM | POA: Diagnosis not present

## 2019-03-23 DIAGNOSIS — Z6838 Body mass index (BMI) 38.0-38.9, adult: Secondary | ICD-10-CM | POA: Diagnosis not present

## 2019-03-23 DIAGNOSIS — I340 Nonrheumatic mitral (valve) insufficiency: Secondary | ICD-10-CM | POA: Diagnosis not present

## 2019-03-23 DIAGNOSIS — I341 Nonrheumatic mitral (valve) prolapse: Secondary | ICD-10-CM | POA: Diagnosis not present

## 2019-04-12 ENCOUNTER — Telehealth: Payer: Self-pay | Admitting: *Deleted

## 2019-04-12 NOTE — Telephone Encounter (Signed)
Submitted PA Ajovy on CMM. Key: [id number]. Waiting on determination.

## 2019-04-16 DIAGNOSIS — F331 Major depressive disorder, recurrent, moderate: Secondary | ICD-10-CM | POA: Diagnosis not present

## 2019-04-16 DIAGNOSIS — E782 Mixed hyperlipidemia: Secondary | ICD-10-CM | POA: Diagnosis not present

## 2019-04-16 DIAGNOSIS — F900 Attention-deficit hyperactivity disorder, predominantly inattentive type: Secondary | ICD-10-CM | POA: Diagnosis not present

## 2019-04-16 DIAGNOSIS — F419 Anxiety disorder, unspecified: Secondary | ICD-10-CM | POA: Diagnosis not present

## 2019-04-17 NOTE — Telephone Encounter (Signed)
Ajovy has been denied by [facility] d/t plan requirement that pt try/fail Emgality and Aimovoig first. Pt should be able to use the savings card. If we should choose to appeal, fax to [facility] Level 1 @ [phone].

## 2019-04-17 NOTE — Telephone Encounter (Signed)
Thank you, if she can use the savings card then great. Otherwise we can see if she is ok switching thanks

## 2019-04-18 NOTE — Telephone Encounter (Signed)
Noted thanks °

## 2019-04-25 ENCOUNTER — Encounter: Payer: Self-pay | Admitting: *Deleted

## 2019-05-03 ENCOUNTER — Other Ambulatory Visit: Payer: Self-pay | Admitting: Neurology

## 2019-05-03 DIAGNOSIS — G43009 Migraine without aura, not intractable, without status migrainosus: Secondary | ICD-10-CM

## 2019-05-04 NOTE — Telephone Encounter (Signed)
I called the pt and LVM (ok per DPR) asking for a call back or mychart message to discuss the Zofran and Imitrex refill requests. Informed pt we see here that the last refills were 07/2018 and I would like to clarify if she actively requested the refills or perhaps they were automatic. Left office number in message. Advised pt we would hold the refills for now until we hear from her.

## 2019-05-18 ENCOUNTER — Telehealth: Payer: Self-pay

## 2019-05-18 NOTE — Telephone Encounter (Signed)
I called [employer]s at [phone] to state a fax was receive about the PA for Ajovy. I stated it was denied and we are not doing an appeal. The rep stated they will delete the PA request.

## 2019-06-09 ENCOUNTER — Other Ambulatory Visit: Payer: Self-pay | Admitting: Neurology

## 2019-06-09 DIAGNOSIS — G43711 Chronic migraine without aura, intractable, with status migrainosus: Secondary | ICD-10-CM

## 2019-06-23 IMAGING — XA DG FLUORO GUIDE LUMBAR PUNCTURE
2 series · 2 of 2 positions shown · non-contrast
Comparison: none

CLINICAL DATA: Possible pseudotumor cerebri.

[Series 1: ortho standard · 1 of 1 slices shown (1 of 2)]
[im 1/1]
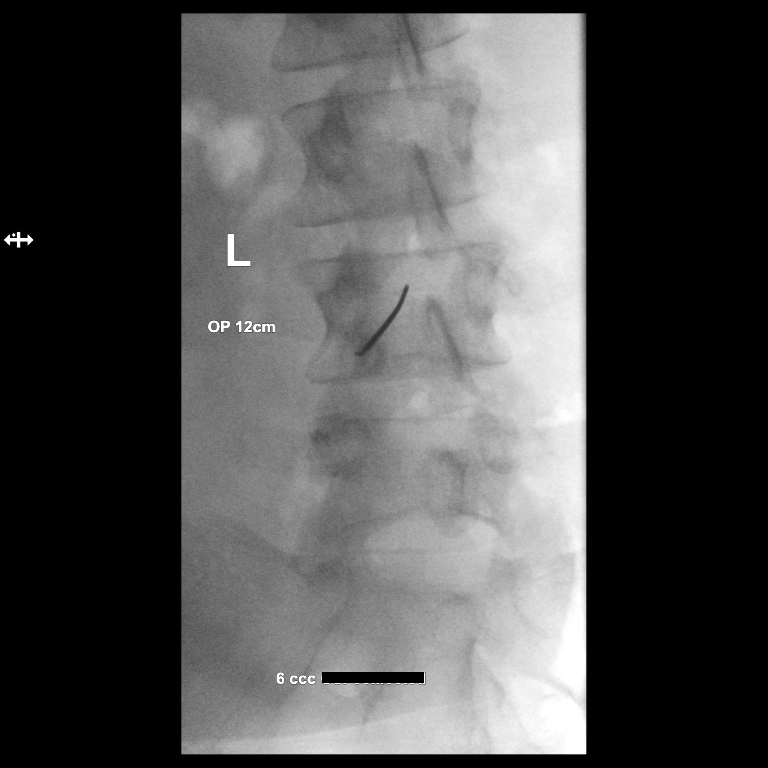

[Series 2: ortho standard · 1 of 1 slices shown (2 of 2)]
[im 1/1]
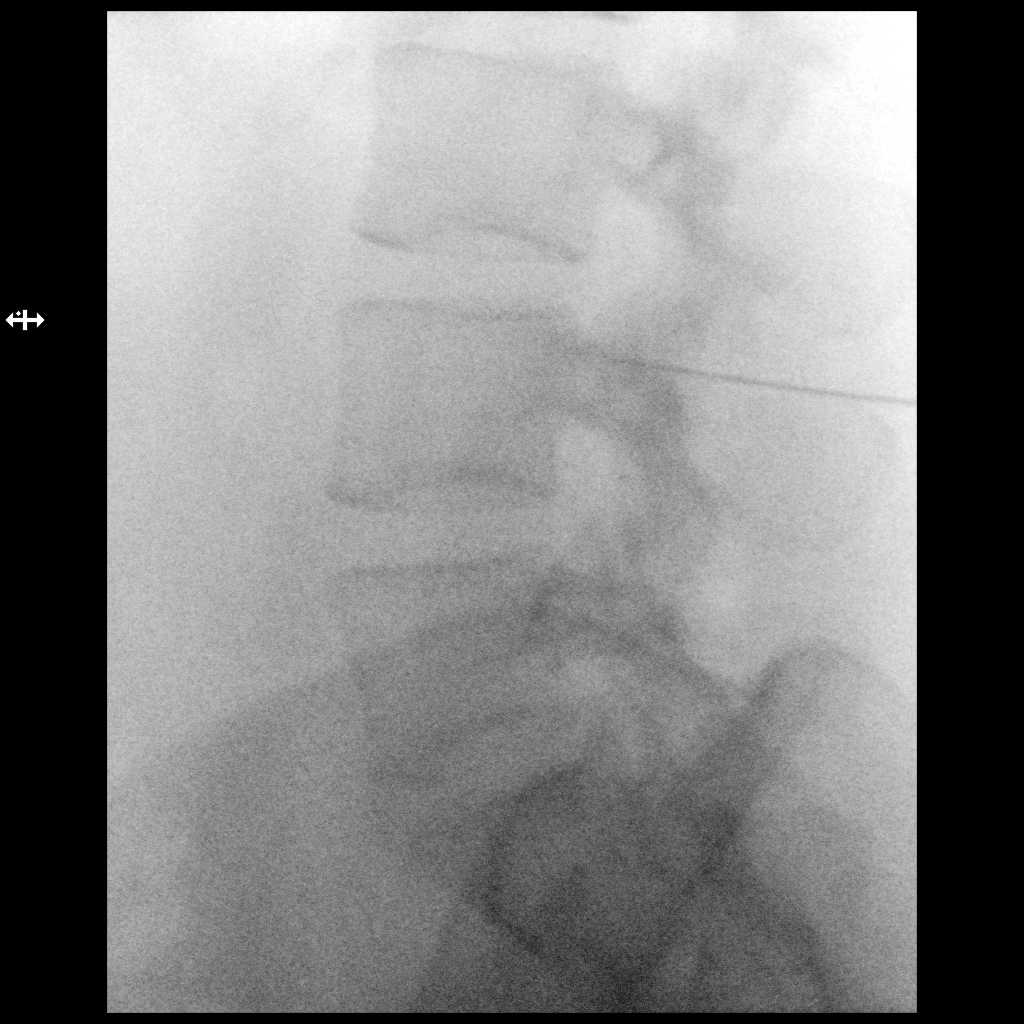

[2 of 2 positions shown; findings below may reference images not displayed]

EXAM:
DIAGNOSTIC LUMBAR PUNCTURE UNDER FLUOROSCOPIC GUIDANCE

FLUOROSCOPY TIME:  14 seconds corresponding to a Dose Area Product
of 49.22 Gy*m2

PROCEDURE:
Informed consent was obtained from the patient prior to the
procedure, including potential complications of headache, allergy,
and pain. Time-out was performed.

With the patient lateral decubitus, the lower back was prepped with
Betadine. 1% Lidocaine was used for local anesthesia. Lumbar
puncture was performed at the L3-L4 level using a 6 inch, 20 gauge
needle with return of clear CSF with an opening pressure of 12 cm
water. 3.0 ml of CSF were obtained for laboratory studies. Closing
pressure was also 12 cm water. The patient tolerated the procedure
well and there were no apparent complications.
IMPRESSION: Technically successful diagnostic lumbar puncture. Normal opening
pressure. Clear colorless fluid.

## 2019-08-23 ENCOUNTER — Telehealth: Payer: Self-pay

## 2019-08-23 NOTE — Telephone Encounter (Signed)
Pharmacy called requesting a PA for the following  1.) Medication Name: ajovy 2.) Pharmacy calling: [facility]  3.) Follow up information: checking PA status for the medication  Phone number: [phone] ext:[phone]  Fax number: N/A

## 2019-08-29 NOTE — Telephone Encounter (Signed)
Spoke with Erskine Squibb @ [facility] and advised Ajovy PA was denied last September. She verbalized appreciation for the update.

## 2019-11-22 MED ORDER — AJOVY 225 MG/1.5ML ~~LOC~~ SOAJ: 225.0000 mg | SUBCUTANEOUS | 2 refills | Status: DC

## 2019-12-25 NOTE — Progress Notes (Addendum)
PATIENT: Alicia Weiss DOB: 1991-02-12  REASON FOR VISIT: follow up HISTORY FROM: patient  Virtual Visit via Telephone Note  I connected with Alicia Weiss on 12/26/19 at  8:30 AM EDT by telephone and verified that I am speaking with the correct person using two identifiers.   I discussed the limitations, risks, security and privacy concerns of performing an evaluation and management service by telephone and the availability of in person appointments. I also discussed with the patient that there may be a patient responsible charge related to this service. The patient expressed understanding and agreed to proceed.   History of Present Illness:  12/26/19 Alicia Weiss is a 29 y.o. female here today for follow up for migraines. She continues Ajovy for migraine prevention. She feels that it works well. She may have 4-5 headaches per month with maybe 1 migraine each month. Sumatriptan used for abortive therapy. She has not used CPAP recently. She feels that pressure settings are not correct. She is managed by PCP in [place]. She is also seeing her chiropractor regularly for chronic neck pain. She has seasonal allergies that contribute to headaches. She had an MRI in 2019 that was reportedly normal.   History (copied from Dr Cathren Laine note on 12/22/2018)  Interval history 12/22/2018: She tried dry needling and went to PT. Didn't really helped. She has a lot of tension in her neck. She has mild sleep apnea and has not been using cpap due to congestion and sinus problems. She has allergies right now. Migraines are better. More neck tightness. 5 days migraines. 15 total days headache, improved from daily.   Interval history 05/04/2018: Patient is here for migraine f/u.  Work-up was negative for intracranial hypertension in fact opening pressure was 12 cmH2O. She had a sleep study pending results. She stopped taking the Topiramate because of side effects. The propranolol has also caused side effects. The  migraines improved some with the medication. She has daily headaches, 15 migraine days a month, for close to a year, no aura, no medication overuse, migraines last 24-72 hours and are severe.  Usually unilateral on the left but can switch, radiates to the neck, pounding, pulsating, photo/phono/osmophobia, nausea no vomiting. Movement makes it worse, a dark room helps. She has 15 migraine days a month, but daily headaches.   HPI:  Alicia Weiss is a 29 y.o. female here as a referral from Dr. Raenette Rover for migraines. Migraines started 5 years ago. No Fhx of migraines. No inciting event. Recently in the last few months they have worsened. Most of the time she wakes up with a headache (not a migraine). She snores. She is obese. She is having a sleep study. Usually unilateral on the left but can switch, radiates to the neck, pounding, pulsating, photo/phono/osmophobia, nausea no vomiting. Movement makes it worse, a dark room helps. She has 15 migraine days a month, but daily headaches. The morning headaches are new in the last few months. In the morning the headache can be occipital or all over. No numbness, tingling, she feels weak, her eyes hurt and hurts to move them. No aura. They usually last 24 hours if not treated. This frequency and severity for over 6 months, she is missing a lot of work. Hears pulsating in the ears and she will sometimes lose hearing in one ear. A band of pressure can also be felt around the head. No other focal neurologic deficits, associated symptoms, inciting events or modifiable factors. She has had  MRIs the last was in 2017. Sister has IIH and a chiari malformation.   meds tried: Gabapentin, imitrex, maxalt, zofran, flexeril, celexa, cymbalta  Reviewed notes, labs and imaging from outside physicians, which showed:  Reviewed referring physician notes.  Patient has a history of migraine headaches diagnosed 5 years ago.  Migraines have been treated by her neurologist in [place] in  the past.  She has been on sumatriptan hand for acute management.  States migraines occur 4 times per month.  No aura.  Occasionally she wakes up with a migraine and this is fairly new.  States her psychiatrist would like for her to have a sleep study.  States she takes sumatriptan 100 mg or Maxalt 10 mg, ibuprofen 600 mg at onset.  Maxalt is not as effective as sumatriptan.  Migraines last from 1 to 5 days but are not constant.  TSH nml 05/2017  Will request MRI report: Unable to get records, will asked patient to provide [place] the information.  Observations/Objective:  Generalized: Well developed, in no acute distress  Mentation: Alert oriented to time, place, history taking. Follows all commands speech and language fluent   Assessment and Plan:  29 y.o. 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 here with    ICD-10-CM   1. Chronic migraine w/o aura w/o status migrainosus, not intractable  G43.709   2. OSA on CPAP  G47.33    Z99.89    Burna is doing well on Ajovy and sumatriptan.  She is tolerating these medications well.  We will continue current therapy.  She does continue to have about 5 headache days each month.  She feels that multiple factors are contributing including sleep apnea, neck pain and seasonal allergies.  She is followed closely by primary care and to work on management of comorbidities.  She continues close follow-up with psychiatry as well.  She was encouraged to focus on healthy lifestyle habits with adequate hydration, well-balanced diet and regular exercise.  She will follow-up in 1 year, sooner if needed.  She verbalizes understanding and agreement with this plan.  No orders of the defined types were placed in this encounter.   No orders of the defined types were  placed in this encounter.    Follow Up Instructions:  I discussed the assessment and treatment plan with the patient. The patient was provided an opportunity to ask questions and all were answered. The patient agreed with the plan and demonstrated an understanding of the instructions.   The patient was advised to call back or seek an in-person evaluation if the symptoms worsen or if the condition fails to improve as anticipated.  I provided 15 minutes of non-face-to-face time during this encounter.  Patient is located at her place of employment during my chart visit.  Provider is in the office.   Shawnie Dapper, NP   Made any corrections needed, and agree with history, physical, neuro exam,assessment and plan as stated.     Naomie Dean, MD [facility]  Made any corrections needed, and agree with history, physical, neuro exam,assessment and plan as stated.     Naomie Dean, MD [facility]

## 2019-12-26 ENCOUNTER — Encounter: Payer: Self-pay | Admitting: Family Medicine

## 2019-12-26 ENCOUNTER — Telehealth (INDEPENDENT_AMBULATORY_CARE_PROVIDER_SITE_OTHER): Payer: BC Managed Care – PPO | Admitting: Family Medicine

## 2019-12-26 DIAGNOSIS — G4733 Obstructive sleep apnea (adult) (pediatric): Secondary | ICD-10-CM | POA: Diagnosis not present

## 2019-12-26 DIAGNOSIS — G43009 Migraine without aura, not intractable, without status migrainosus: Secondary | ICD-10-CM

## 2019-12-26 DIAGNOSIS — Z9989 Dependence on other enabling machines and devices: Secondary | ICD-10-CM | POA: Diagnosis not present

## 2019-12-26 DIAGNOSIS — G43709 Chronic migraine without aura, not intractable, without status migrainosus: Secondary | ICD-10-CM | POA: Diagnosis not present

## 2019-12-26 MED ORDER — METHOCARBAMOL 750 MG PO TABS: 750.0000 mg | ORAL_TABLET | Freq: Three times a day (TID) | ORAL | 2 refills | Status: AC | PRN

## 2019-12-26 MED ORDER — AJOVY 225 MG/1.5ML ~~LOC~~ SOAJ: 225.0000 mg | SUBCUTANEOUS | 3 refills | Status: DC

## 2019-12-26 MED ORDER — SUMATRIPTAN SUCCINATE 100 MG PO TABS: ORAL_TABLET | ORAL | 10 refills | Status: DC

## 2019-12-27 ENCOUNTER — Other Ambulatory Visit: Payer: Self-pay | Admitting: Family Medicine

## 2019-12-27 DIAGNOSIS — G43009 Migraine without aura, not intractable, without status migrainosus: Secondary | ICD-10-CM

## 2019-12-27 MED ORDER — SUMATRIPTAN SUCCINATE 100 MG PO TABS: ORAL_TABLET | ORAL | 10 refills | Status: AC

## 2020-05-14 ENCOUNTER — Other Ambulatory Visit: Payer: Self-pay | Admitting: Neurology

## 2020-09-11 ENCOUNTER — Telehealth: Payer: Self-pay

## 2020-09-11 NOTE — Telephone Encounter (Signed)
I have submitted a PA request for Ajovy on [facility], Key: [id number] - PA Case ID: [id number].   Awaiting determination.  Propranolol, Topamax, Gabapentin, Imitrex, Maxalt, Zofran, Flexeril, Celexa, Cymbalta

## 2020-09-11 NOTE — Telephone Encounter (Signed)
Ajovy denied, but it looks like patient has been getting it with a savings card.  Health plan's criteria, she must meet the following: (1) Two of the following:       (A) You have tried amitriptyline or                 venlafaxine for at least two months or cannot      use the drugs.     (B) You have tried divalproex sodium or      topiramate for at least two months or cannot       use the drugs.      (C) You have tried one beta blocker drug  (that is, atenolol, propranolol, nadolol, timolol, or metoprolol) for at least two months or cannot use the drugs.       (D) You have tried candesartan for at least two months or cannot use the drug. (2) You have tried or cannot use both Aimovig and Emgality.  Reviewed by: Dub Mikes, RPh  Case number: [id number]

## 2020-09-11 NOTE — Telephone Encounter (Signed)
Noted  

## 2020-09-24 NOTE — Telephone Encounter (Signed)
Please let her know that her insurance is requiring that she try other medications before Ajovy will be approved. We could send appeal but I do not think it will help as she has not tried and failed these options: venlafaxine/amitriptyline or candesartan. Does she have any significant history of depression or currently on medications to treat depression? What are her normal blood pressure readings? Per the PA, we may be able to try Emgality or Amovig if she wishes. They are both very similar to Ajovy, however, may also get denies through PA due to the same reasoning.

## 2020-09-24 NOTE — Telephone Encounter (Signed)
Appointment set to pt [facility] with Amy NP tomorrow at 0930 for med management.

## 2020-09-24 NOTE — Progress Notes (Addendum)
PATIENT: Alicia Weiss DOB: 05-01-1991  REASON FOR VISIT: follow up HISTORY FROM: patient  Virtual Visit via Telephone Note  I connected with [name] on 09/25/20 at  9:30 AM EST by telephone and verified that I am speaking with the correct person using two identifiers.   I discussed the limitations, risks, security and privacy concerns of performing an evaluation and management service by telephone and the availability of in person appointments. I also discussed with the patient that there may be a patient responsible charge related to this service. The patient expressed understanding and agreed to proceed.   History of Present Illness:  09/25/20 ALL:  Alicia Weiss returns today for migraine follow up. She has been doing well on Ajovy and sumatriptan. Recently, insurance has denied coverage and she can no longer use copay card. On Ajovy, she has 1 migraine per month, on average. She rarely has to use abortive medications. She reports that sumatriptan works when needed.   She has a significant history of major depressive and anxiety disorder. She has tried and failed multiple antidepressants/antianxiety agents. She is currently on hydroxyzine as needed. She continues Viibryd for ADD. She is followed by psychiatry. She has a history of exercise induced asthma controlled with albuterol prn. She also has a history of GERD managed with propranolol and constipation treated with PRN OTC medications.   She is currently on HCTZ for HTN. BP is usually around 120/80. She is hesitant to add any other agents that could effect blood pressure readings.   Meds tried and failed: Prevention: Ajovy (on now and effective), amitriptyline (previously with psychiatry), Botox, topiramate (tingling/pain, brain fog), propranolol, gabapentin, Celexa, Cymbalta (caused worsening headaches and brain fog), escitalopram, paroxetine.  Abortive meds: Imitrex, Maxalt, Zofran, Flexeril  She does have a history of OSA. She has  not used CPAP recently. She reports apnea was previously managed by PCP but has not been addressed since moving to [place]. She has not used her CPAP recently due to [place] recall. She does not use external cleaners and no exposure to heat.   12/26/2019 ALL:  Alicia Weiss is a 30 y.o. female here today for follow up for migraines. She continues Ajovy for migraine prevention. She feels that it works well. She may have 4-5 headaches per month with maybe 1 migraine each month. Sumatriptan used for abortive therapy. She has not used CPAP recently. She feels that pressure settings are not correct. She is managed by PCP in [place]. She is also seeing her chiropractor regularly for chronic neck pain. She has seasonal allergies that contribute to headaches. She had an MRI in 2019 that was reportedly normal.   History (copied from Dr Trevor Mace note on 12/22/2018)  Interval history 12/22/2018: She tried dry needling and went to PT. Didn't really helped. She has a lot of tension in her neck. She has mild sleep apnea and has not been using cpap due to congestion and sinus problems. She has allergies right now. Migraines are better. More neck tightness. 5 days migraines. 15 total days headache, improved from daily.   Interval history 05/04/2018: Patient is here for migraine f/u.  Work-up was negative for intracranial hypertension in fact opening pressure was 12 cmH2O. She had a sleep study pending results. She stopped taking the Topiramate because of side effects. The propranolol has also caused side effects. The migraines improved some with the medication. She has daily headaches, 15 migraine days a month, for close to a year, no aura, no  medication overuse, migraines last 24-72 hours and are severe.  Usually unilateral on the left but can switch, radiates to the neck, pounding, pulsating, photo/phono/osmophobia, nausea no vomiting. Movement makes it worse, a dark room helps. She has 15 migraine days a month, but  daily headaches.   HPI:  Alicia Weiss is a 30 y.o. female here as a referral from Dr. Suezanne Jacquet for migraines. Migraines started 5 years ago. No Fhx of migraines. No inciting event. Recently in the last few months they have worsened. Most of the time she wakes up with a headache (not a migraine). She snores. She is obese. She is having a sleep study. Usually unilateral on the left but can switch, radiates to the neck, pounding, pulsating, photo/phono/osmophobia, nausea no vomiting. Movement makes it worse, a dark room helps. She has 15 migraine days a month, but daily headaches. The morning headaches are new in the last few months. In the morning the headache can be occipital or all over. No numbness, tingling, she feels weak, her eyes hurt and hurts to move them. No aura. They usually last 24 hours if not treated. This frequency and severity for over 6 months, she is missing a lot of work. Hears pulsating in the ears and she will sometimes lose hearing in one ear. A band of pressure can also be felt around the head. No other focal neurologic deficits, associated symptoms, inciting events or modifiable factors. She has had MRIs the last was in 2017. Sister has IIH and a chiari malformation.   meds tried: Gabapentin, imitrex, maxalt, zofran, flexeril, celexa, cymbalta  Reviewed notes, labs and imaging from outside physicians, which showed:  Reviewed referring physician notes.  Patient has a history of migraine headaches diagnosed 5 years ago.  Migraines have been treated by her neurologist in [place] in the past.  She has been on sumatriptan hand for acute management.  States migraines occur 4 times per month.  No aura.  Occasionally she wakes up with a migraine and this is fairly new.  States her psychiatrist would like for her to have a sleep study.  States she takes sumatriptan 100 mg or Maxalt 10 mg, ibuprofen 600 mg at onset.  Maxalt is not as effective as sumatriptan.  Migraines last from 1 to 5 days  but are not constant.  TSH nml 05/2017  Will request MRI report: Unable to get records, will asked patient to provide [place] the information.  Observations/Objective:  Generalized: Well developed, in no acute distress  Mentation: Alert oriented to time, place, history taking. Follows all commands speech and language fluent   Assessment and Plan:  30 y.o. 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 here with    ICD-10-CM   1. Migraine without aura and without status migrainosus, not intractable  G43.009   2. OSA on CPAP  G47.33    Z99.89      Mikele is doing well on Ajovy and sumatriptan.  She is tolerating these medications well and migraines are very well managed. Unfortunately, her insurance provider is denying coverage to continue Ajovy. She has tried and failed multiple anxiety/antidepressants as listed above and currently feels depression and anxiety are well managed. She is followed closely by psychiatry and is hesitant to try any agents that can effect mood. She also has a history of GERD and constipation. Amovig could worsen  this condition. Blood pressure is well managed on HCTZ and could be adversely effects on other antihypertensives such as candesartan. I will send an appeal letter to her payer to ask that she continue Ajovy and sumatriptan. She was encouraged to consider using copay card with higher copay of 50 dollars if insurance denies coverage. If Ajovy is not covered, she will consider other treatment options such as Botox. She was encouraged to continue healthy lifestyle habits. She will follow up pending insurance decision and need for change in treatment plan.    No orders of the defined types were placed in this encounter.   No orders of the defined types were placed in  this encounter.    Follow Up Instructions:  I discussed the assessment and treatment plan with the patient. The patient was provided an opportunity to ask questions and all were answered. The patient agreed with the plan and demonstrated an understanding of the instructions.   The patient was advised to call back or seek an in-person evaluation if the symptoms worsen or if the condition fails to improve as anticipated.  I provided 15 minutes of non-face-to-face time during this encounter.  Patient is located at her place of employment during my chart visit.  Provider is in the office.   Shawnie Dapper, NP     Made any corrections needed, and agree with history, physical, neuro exam,assessment and plan as stated.     Naomie Dean, MD [facility]

## 2020-09-25 ENCOUNTER — Telehealth: Payer: Self-pay | Admitting: *Deleted

## 2020-09-25 ENCOUNTER — Encounter: Payer: Self-pay | Admitting: Family Medicine

## 2020-09-25 ENCOUNTER — Telehealth (INDEPENDENT_AMBULATORY_CARE_PROVIDER_SITE_OTHER): Payer: 59 | Admitting: Family Medicine

## 2020-09-25 DIAGNOSIS — Z9989 Dependence on other enabling machines and devices: Secondary | ICD-10-CM | POA: Diagnosis not present

## 2020-09-25 DIAGNOSIS — G4733 Obstructive sleep apnea (adult) (pediatric): Secondary | ICD-10-CM | POA: Diagnosis not present

## 2020-09-25 DIAGNOSIS — G43009 Migraine without aura, not intractable, without status migrainosus: Secondary | ICD-10-CM

## 2020-09-25 MED ORDER — AJOVY 225 MG/1.5ML ~~LOC~~ SOAJ: 225.0000 mg | SUBCUTANEOUS | 3 refills | Status: DC

## 2020-09-25 NOTE — Telephone Encounter (Signed)
-----   Message from Shawnie Dapper, NP sent at 09/25/2020  9:45 AM EST ----- Can you please resubmit PA for Ajovy based on new encounter notes. She thinks that she has tried and failed amitriptyline as well, although not documents as she is going to confirm with psychiatry. I would really like for her to stay on Ajovy as it is working so well. We may have to swithc to [facility] as that is the only med I do not have a reason to not try but am hoping they may agree to continue Ajovy with new documentation. TY!

## 2020-09-26 NOTE — Telephone Encounter (Signed)
LMVM for pt to see if she found out she tried amitriptyline.  She may mychart email me if easier.

## 2020-09-30 NOTE — Telephone Encounter (Signed)
No records that have come across my desk from psych.  I did LM for her re: taking amitriptyline>

## 2020-10-03 ENCOUNTER — Encounter: Payer: Self-pay | Admitting: *Deleted

## 2020-10-03 NOTE — Addendum Note (Signed)
Addended by: Shawnie Dapper L on: 10/03/2020 07:55 AM   Modules accepted: Orders

## 2020-10-03 NOTE — Telephone Encounter (Signed)
Received fax from [facility]: Ajovy denied, must try both Aimovig and Emgality first. Routed to NP for next steps.

## 2020-10-03 NOTE — Telephone Encounter (Signed)
We will have to try Emgality if access card for Amovig not working. No contraindications for Emgality. Emgality 120mg  every 30 days with 11 refills if she wishes to switch.

## 2020-11-30 ENCOUNTER — Encounter: Payer: Self-pay | Admitting: Family Medicine

## 2020-12-02 MED ORDER — EMGALITY 120 MG/ML ~~LOC~~ SOAJ: SUBCUTANEOUS | 11 refills | Status: DC

## 2020-12-02 NOTE — Telephone Encounter (Signed)
I have submitted PA request for Emgality to [facility], Key: [id number] - PA Case ID: [id number].  Awaiting determination from [facility]

## 2020-12-03 NOTE — Telephone Encounter (Signed)
Emgality approved  12/02/2020 - 06/04/2021

## 2021-02-20 ENCOUNTER — Encounter: Payer: Self-pay | Admitting: Family Medicine

## 2021-02-20 ENCOUNTER — Encounter: Payer: Self-pay | Admitting: *Deleted

## 2021-02-20 ENCOUNTER — Telehealth: Payer: Self-pay | Admitting: Neurology

## 2021-02-20 MED ORDER — AJOVY 225 MG/1.5ML ~~LOC~~ SOAJ: 225.0000 mg | SUBCUTANEOUS | 3 refills | Status: DC

## 2021-02-20 MED ORDER — AJOVY 225 MG/1.5ML ~~LOC~~ SOAJ
225.0000 mg | SUBCUTANEOUS | 3 refills | Status: DC
Start: 2021-02-20 — End: 2021-02-20

## 2021-02-20 NOTE — Telephone Encounter (Signed)
[facility]. They had not received rx e-scribed for Ajovy yet. I provided VO. They will go ahead and start processing for pt.

## 2021-02-20 NOTE — Telephone Encounter (Signed)
PA completed through [facility]  [id number] Waiting for approval

## 2021-02-20 NOTE — Telephone Encounter (Signed)
Called [facility] and cx rx Ajovy sent today. Advised we are sending to another pharmacy per pt request.

## 2021-02-24 NOTE — Telephone Encounter (Signed)
Request Reference Number: [id number]. AJOVY INJ 225/1.5 is approved through 08/23/2021. Your patient may now fill this prescription and it will be covered.

## 2021-09-10 ENCOUNTER — Telehealth: Payer: Self-pay | Admitting: Neurology

## 2021-09-10 NOTE — Telephone Encounter (Signed)
PA submitted on [facility] [id number] Will await response

## 2021-09-10 NOTE — Telephone Encounter (Signed)
Approved today for the pt  Request Reference Number: [id number]. AJOVY INJ 225/1.5 is approved through 09/10/2022. Your patient may now fill this prescription and it will be covered.

## 2022-02-23 ENCOUNTER — Telehealth: Payer: Self-pay | Admitting: Family Medicine

## 2022-02-23 DIAGNOSIS — G43009 Migraine without aura, not intractable, without status migrainosus: Secondary | ICD-10-CM

## 2022-02-23 MED ORDER — AJOVY 225 MG/1.5ML ~~LOC~~ SOAJ: 225.0000 mg | SUBCUTANEOUS | Status: DC

## 2022-02-23 NOTE — Telephone Encounter (Signed)
Pt request refill for Fremanezumab-vfrm (AJOVY) 225 MG/1.5ML SOAJ at [facility].  Pt scheduled MyChart Video Visit on 03/03/22 at 7:30 am.

## 2022-02-23 NOTE — Telephone Encounter (Signed)
Faxed printed rx to pharmacy at [phone]. Received fax confirmation.

## 2022-02-23 NOTE — Telephone Encounter (Signed)
Rx printed. Will have NP sign and then will fax to pharmacy. Advised she must keep f/u for future refills

## 2022-03-02 NOTE — Progress Notes (Unsigned)
PATIENT: Alicia Weiss DOB: October 26, 1990  REASON FOR VISIT: follow up HISTORY FROM: patient  Virtual Visit via Telephone Note  I connected with Alicia Weiss on 03/03/22 at  7:30 AM EDT by telephone and verified that I am speaking with the correct person using two identifiers.   I discussed the limitations, risks, security and privacy concerns of performing an evaluation and management service by telephone and the availability of in person appointments. I also discussed with the patient that there may be a patient responsible charge related to this service. The patient expressed understanding and agreed to proceed.   History of Present Illness:  03/03/22 ALL:  Alicia Weiss returns for follow up for migraines. We switched from Ajovy to [employer] due to insurance coverage, however, Emgality caused joint pain and she was switched back to Ajovy. Since, she reports doing well. She has not had any migraine headaches. She may have 1-2 mild tension headaches per month that are easily aborted with OTC analgesics. She has not used sumatriptan recently. It does help but makes her feel weird.   She has not established care with PCP in [place]. She has not reached out to [place] regarding recalled CPAP machine. She is planning to establish care soon.   09/25/2020 ALL:  Alicia Weiss returns today for migraine follow up. She has been doing well on Ajovy and sumatriptan. Recently, insurance has denied coverage and she can no longer use copay card. On Ajovy, she has 1 migraine per month, on average. She rarely has to use abortive medications. She reports that sumatriptan works when needed.   She has a significant history of major depressive and anxiety disorder. She has tried and failed multiple antidepressants/antianxiety agents. She is currently on hydroxyzine as needed. She continues Viibryd for ADD. She is followed by psychiatry. She has a history of exercise induced asthma controlled with albuterol prn. She also has a  history of GERD managed with propranolol and constipation treated with PRN OTC medications.   She is currently on HCTZ for HTN. BP is usually around 120/80. She is hesitant to add any other agents that could effect blood pressure readings.   Meds tried and failed: Prevention: Ajovy (on now and effective), amitriptyline (previously with psychiatry), Botox, topiramate (tingling/pain, brain fog), propranolol, gabapentin, Celexa, Cymbalta (caused worsening headaches and brain fog), escitalopram, paroxetine.  Abortive meds: Imitrex, Maxalt, Zofran, Flexeril  She does have a history of OSA. She has not used CPAP recently. She reports apnea was previously managed by PCP but has not been addressed since moving to [place]. She has not used her CPAP recently due to [place] recall. She does not use external cleaners and no exposure to heat.   12/26/2019 ALL:  Alicia Weiss is a 31 y.o. female here today for follow up for migraines. She continues Ajovy for migraine prevention. She feels that it works well. She may have 4-5 headaches per month with maybe 1 migraine each month. Sumatriptan used for abortive therapy. She has not used CPAP recently. She feels that pressure settings are not correct. She is managed by PCP in [place]. She is also seeing her chiropractor regularly for chronic neck pain. She has seasonal allergies that contribute to headaches. She had an MRI in 2019 that was reportedly normal.   History (copied from Dr Trevor Mace note on 12/22/2018)  Interval history 12/22/2018: She tried dry needling and went to PT. Didn't really helped. She has a lot of tension in her neck. She has mild sleep apnea  and has not been using cpap due to congestion and sinus problems. She has allergies right now. Migraines are better. More neck tightness. 5 days migraines. 15 total days headache, improved from daily.    Interval history 05/04/2018: Patient is here for migraine f/u.  Work-up was negative for intracranial  hypertension in fact opening pressure was 12 cmH2O. She had a sleep study pending results. She stopped taking the Topiramate because of side effects. The propranolol has also caused side effects. The migraines improved some with the medication. She has daily headaches, 15 migraine days a month, for close to a year, no aura, no medication overuse, migraines last 24-72 hours and are severe.  Usually unilateral on the left but can switch, radiates to the neck, pounding, pulsating, photo/phono/osmophobia, nausea no vomiting. Movement makes it worse, a dark room helps. She has 15 migraine days a month, but daily headaches.    HPI:  Alicia Weiss is a 31 y.o. female here as a referral from Dr. Suezanne Jacquet for migraines. Migraines started 5 years ago. No Fhx of migraines. No inciting event. Recently in the last few months they have worsened. Most of the time she wakes up with a headache (not a migraine). She snores. She is obese. She is having a sleep study. Usually unilateral on the left but can switch, radiates to the neck, pounding, pulsating, photo/phono/osmophobia, nausea no vomiting. Movement makes it worse, a dark room helps. She has 15 migraine days a month, but daily headaches. The morning headaches are new in the last few months. In the morning the headache can be occipital or all over. No numbness, tingling, she feels weak, her eyes hurt and hurts to move them. No aura. They usually last 24 hours if not treated. This frequency and severity for over 6 months, she is missing a lot of work. Hears pulsating in the ears and she will sometimes lose hearing in one ear. A band of pressure can also be felt around the head. No other focal neurologic deficits, associated symptoms, inciting events or modifiable factors. She has had MRIs the last was in 2017. Sister has IIH and a chiari malformation.    meds tried: Gabapentin, imitrex, maxalt, zofran, flexeril, celexa, cymbalta   Reviewed notes, labs and imaging from outside  physicians, which showed:   Reviewed referring physician notes.  Patient has a history of migraine headaches diagnosed 5 years ago.  Migraines have been treated by her neurologist in [place] in the past.  She has been on sumatriptan hand for acute management.  States migraines occur 4 times per month.  No aura.  Occasionally she wakes up with a migraine and this is fairly new.  States her psychiatrist would like for her to have a sleep study.  States she takes sumatriptan 100 mg or Maxalt 10 mg, ibuprofen 600 mg at onset.  Maxalt is not as effective as sumatriptan.  Migraines last from 1 to 5 days but are not constant.   TSH nml 05/2017   Will request MRI report: Unable to get records, will asked patient to provide [place] the information.  Observations/Objective:  Generalized: Well developed, in no acute distress  Mentation: Alert oriented to time, place, history taking. Follows all commands speech and language fluent   Assessment and Plan:  31 y.o. 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 here with  ICD-10-CM   1. Migraine without aura and without status migrainosus, not intractable  G43.009 Fremanezumab-vfrm (AJOVY) 225 MG/1.5ML SOAJ       Alicia Weiss is doing well on Ajovy. We will continue monthly injections. May use sumatriptan as needed. I have encouraged her to establish care with PCP in [place]. Consider resuming CPAP. Contact [facility] regarding recall. She was encouraged to continue healthy lifestyle habits. She will follow up in 1 year.    No orders of the defined types were placed in this encounter.   Meds ordered this encounter  Medications   Fremanezumab-vfrm (AJOVY) 225 MG/1.5ML SOAJ    Sig: Inject 225 mg into the skin every 30 (thirty) days. Must keep follow up 03/03/2022  for future refills    Dispense:  4.5 mL    Refill:  3    4.83ml/90days    Order Specific Question:   Supervising Provider    Answer:   Anson Fret [[id number]]     Follow Up Instructions:  I discussed the assessment and treatment plan with the patient. The patient was provided an opportunity to ask questions and all were answered. The patient agreed with the plan and demonstrated an understanding of the instructions.   The patient was advised to call back or seek an in-person evaluation if the symptoms worsen or if the condition fails to improve as anticipated.  I provided 15 minutes of non-face-to-face time during this encounter.  Patient is located at her place of employment during my chart visit.  Provider is in the office.   Shawnie Dapper, NP

## 2022-03-03 ENCOUNTER — Telehealth (INDEPENDENT_AMBULATORY_CARE_PROVIDER_SITE_OTHER): Payer: 59 | Admitting: Family Medicine

## 2022-03-03 ENCOUNTER — Encounter: Payer: Self-pay | Admitting: Family Medicine

## 2022-03-03 DIAGNOSIS — G43009 Migraine without aura, not intractable, without status migrainosus: Secondary | ICD-10-CM

## 2022-03-03 MED ORDER — AJOVY 225 MG/1.5ML ~~LOC~~ SOAJ: 225.0000 mg | SUBCUTANEOUS | 3 refills | Status: AC

## 2022-03-03 NOTE — Patient Instructions (Signed)
Below is our plan:  We will continue Ajovy monthly and sumatriptan as needed.   Please make sure you are staying well hydrated. I recommend 50-60 ounces daily. Well balanced diet and regular exercise encouraged. Consistent sleep schedule with 6-8 hours recommended.   Please continue follow up with care team as directed.   Follow up with me in 1 year   You may receive a survey regarding today's visit. I encourage you to leave honest feed back as I do use this information to improve patient care. Thank you for seeing me today!

## 2022-09-15 ENCOUNTER — Encounter: Payer: Self-pay | Admitting: Family Medicine

## 2022-10-08 NOTE — Telephone Encounter (Signed)
Submitted urgent PA Ajovy on covermymeds. Key: [id number]. Waiting on determination from [place].

## 2022-10-12 NOTE — Telephone Encounter (Signed)
PA denied, waiting on denial letter detailing reason for denial

## 2022-10-13 NOTE — Telephone Encounter (Signed)
Called [employer] at [phone]. Spoke w/ Angela Nevin. States they no longer use covermymeds to review PA's for [employer]. Can call to get correct fax form in future/use [employer] portal for providers.  Reports for this pt,we can now do provider courtesy review. They have 7 days to review. On cover sheet, put Attn: PCR, Pt name/DOB, fax: [phone]. I faxed last office notes as requested. Waiting on determination.

## 2022-10-13 NOTE — Telephone Encounter (Addendum)
We have not received denial, not available on covermymeds. Submitted appeal on covermymeds. Key: [id number]. Received the following response from [employer]:  "System is not able to process this request because the previous Prior Authorization Appeal Request was Denied, please contact the independent review entity for further information."  PA Case ID #: [id number]

## 2023-01-13 ENCOUNTER — Telehealth: Payer: Self-pay

## 2023-01-13 NOTE — Telephone Encounter (Signed)
Please call patient backl

## 2023-01-14 NOTE — Transitions of Care (Post Inpatient/ED Visit) (Signed)
01/14/2023  Name: Alicia Weiss MRN: [id number] DOB: Jun 28, 1991  Today's TOC FU Call Status: Today's TOC FU Call Status:: Successful TOC FU Call Competed TOC FU Call Complete Date: 01/14/23  Transition Care Management Follow-up Telephone Call Date of Discharge: 01/11/23 Discharge Facility: Other ([facility]) Name of Other ([facility]) [facility] Facility[facility] Type of Discharge: Emergency Department Reason for ED Visit: Other: (anaphylaxis) How have you been since you were released from the hospital?: Better Any questions or concerns?: No  Items Reviewed: Did you receive and understand the discharge instructions provided?: Yes Medications obtained,verified, and reconciled?: Yes (Medications Reviewed) Any new allergies since your discharge?: No Dietary orders reviewed?: No Do you have support at home?: Yes People in Home: parent(s)  Medications Reviewed Today: Medications Reviewed Today     Reviewed by Annabell Sabal, CMA (Certified Medical Assistant) on 01/14/23 at [phone]  Med List Status: <None>   Medication Order Taking? Sig Documenting Provider Last Dose Status Informant  albuterol (PROVENTIL HFA;VENTOLIN HFA) 108 (90 Base) MCG/ACT inhaler [id number] Yes Inhale 2 puffs into the lungs every 6 (six) hours as needed for wheezing or shortness of breath. Henson, Vickie L, NP-C Taking Active   amLODipine (NORVASC) 2.5 MG tablet [id number] Yes Take 2.5 mg by mouth daily. [provider] Taking Active   atomoxetine (STRATTERA) 25 MG capsule [id number] Yes Take 25 mg by mouth daily. [provider] Taking Active   azelastine (ASTELIN) 0.1 % nasal spray [id number] Yes Place 2 sprays into both nostrils 2 (two) times daily. Use in each nostril as directed Anson Fret, MD Taking Active   Biotin 1000 MCG tablet [id number] Yes Take 1,000 mcg by mouth daily.  [provider] Taking Active   busPIRone (BUSPAR) 5 MG tablet [id number] Yes Take 5 mg by mouth daily.  [provider] Taking Active   Cholecalciferol (VITAMIN D PO) [id number] Yes Take 1,000 Units by mouth. [provider] Taking Active   Fremanezumab-vfrm (AJOVY) 225 MG/1.5ML [place] [id number] Yes Inject 225 mg into the skin every 30 (thirty) days. Must keep follow up 03/03/2022 for future refills Lomax, Amy, NP Taking Active   hydrochlorothiazide (HYDRODIURIL) 25 MG tablet [id number] Yes Take 25 mg by mouth daily. [provider] Taking Active   hydrOXYzine (ATARAX/VISTARIL) 10 MG tablet [id number] Yes Take 10 mg by mouth daily as needed. [provider] Taking Active   hydrOXYzine (VISTARIL) 25 MG capsule [id number] Yes Take 25 mg by mouth as needed. [provider] Taking Active   JUNEL FE 1.5/30 1.5-30 MG-MCG tablet [id number] Yes Take 1 tablet by mouth daily. [provider] Taking Active   methocarbamol (ROBAXIN-750) 750 MG tablet [id number] Yes Take 1 tablet (750 mg total) by mouth every 8 (eight) hours as needed for muscle spasms. Lomax, Amy, NP Taking Active   Multiple Vitamin (MULTIVITAMIN) capsule [id number] Yes Take 1 capsule by mouth daily. [provider] Taking Active   ondansetron (ZOFRAN) 4 MG tablet [id number] Yes TAKE 1 TABLET BY MOUTH EVERY 8 HOURS AS NEEDED FOR NAUSEA AND VOMITING Lomax, Amy, NP Taking Active   pantoprazole (PROTONIX) 40 MG tablet [id number] Yes Take 40 mg by mouth daily. [provider] Taking Active   SUMAtriptan (IMITREX) 100 MG tablet [id number] Yes Take 1 tablet at onset of headache, May take 1 additional tablet in 2 hours if headache persists or recurs. Do not exceed 2 tablets in 24 hours or 10 tablets per month Lomax, Amy, NP Taking Active   Vilazodone HCl (VIIBRYD) 40  MG TABS [id number] Yes Take 40 mg by mouth daily. [provider] Taking Active             Home Care and Equipment/Supplies: Were Home Health Services Ordered?: No Any new equipment or medical supplies ordered?:  No  Functional Questionnaire: Do you need assistance with bathing/showering or dressing?: No Do you need assistance with meal preparation?: No Do you need assistance with eating?: No Do you have difficulty maintaining continence: No Do you need assistance with getting out of bed/getting out of a chair/moving?: No Do you have difficulty managing or taking your medications?: No  Follow up appointments reviewed: PCP Follow-up appointment confirmed?: NA (patient  declined appointment at this time she is out town staying with her mother, will call back if needed) Specialist Hospital Follow-up appointment confirmed?: NA Do you need transportation to your follow-up appointment?: No Do you understand care options if your condition(s) worsen?: Yes-patient verbalized understanding    SIGNATURE Fredirick Maudlin
